# Patient Record
Sex: Female | Born: 1955 | Race: Black or African American | Hispanic: No | Marital: Married | Smoking: Former smoker
Health system: Southern US, Community
[De-identification: ages and names within clinical notes are randomized; demographics above are authoritative.]

## PROBLEM LIST (undated history)

## (undated) DIAGNOSIS — T7840XA Allergy, unspecified, initial encounter: Secondary | ICD-10-CM

## (undated) DIAGNOSIS — I10 Essential (primary) hypertension: Secondary | ICD-10-CM

## (undated) DIAGNOSIS — I739 Peripheral vascular disease, unspecified: Secondary | ICD-10-CM

## (undated) DIAGNOSIS — M1990 Unspecified osteoarthritis, unspecified site: Secondary | ICD-10-CM

## (undated) DIAGNOSIS — E7800 Pure hypercholesterolemia, unspecified: Secondary | ICD-10-CM

## (undated) HISTORY — PX: ABDOMINAL HYSTERECTOMY: SHX81

## (undated) HISTORY — DX: Unspecified osteoarthritis, unspecified site: M19.90

## (undated) HISTORY — DX: Essential (primary) hypertension: I10

## (undated) HISTORY — DX: Allergy, unspecified, initial encounter: T78.40XA

## (undated) HISTORY — PX: BREAST CYST ASPIRATION: SHX578

---

## 2003-11-19 ENCOUNTER — Ambulatory Visit: Payer: Self-pay | Admitting: Internal Medicine

## 2005-03-02 ENCOUNTER — Ambulatory Visit: Payer: Self-pay | Admitting: Internal Medicine

## 2005-04-29 ENCOUNTER — Ambulatory Visit: Payer: Self-pay | Admitting: Gastroenterology

## 2006-04-06 ENCOUNTER — Ambulatory Visit: Payer: Self-pay | Admitting: Internal Medicine

## 2007-04-13 ENCOUNTER — Ambulatory Visit: Payer: Self-pay | Admitting: Internal Medicine

## 2009-01-29 ENCOUNTER — Ambulatory Visit: Payer: Self-pay

## 2009-07-04 ENCOUNTER — Ambulatory Visit: Payer: Self-pay | Admitting: Physician Assistant

## 2010-02-17 ENCOUNTER — Ambulatory Visit: Payer: Self-pay | Admitting: Internal Medicine

## 2011-04-20 ENCOUNTER — Ambulatory Visit: Payer: Self-pay | Admitting: Internal Medicine

## 2011-08-18 ENCOUNTER — Ambulatory Visit: Payer: Self-pay | Admitting: Neurology

## 2012-08-02 ENCOUNTER — Ambulatory Visit: Payer: Self-pay | Admitting: Internal Medicine

## 2012-08-03 ENCOUNTER — Ambulatory Visit: Payer: Self-pay | Admitting: Internal Medicine

## 2012-10-17 ENCOUNTER — Ambulatory Visit: Payer: Self-pay | Admitting: Vascular Surgery

## 2012-10-17 LAB — BUN: BUN: 15 mg/dL (ref 7–18)

## 2012-10-17 LAB — CREATININE, SERUM: EGFR (Non-African Amer.): 51 — ABNORMAL LOW

## 2012-12-14 ENCOUNTER — Ambulatory Visit: Payer: Self-pay | Admitting: Internal Medicine

## 2012-12-22 ENCOUNTER — Ambulatory Visit: Payer: Self-pay | Admitting: Gastroenterology

## 2013-08-06 ENCOUNTER — Ambulatory Visit: Payer: Self-pay | Admitting: Internal Medicine

## 2013-09-22 DIAGNOSIS — G629 Polyneuropathy, unspecified: Secondary | ICD-10-CM | POA: Insufficient documentation

## 2013-09-22 DIAGNOSIS — I129 Hypertensive chronic kidney disease with stage 1 through stage 4 chronic kidney disease, or unspecified chronic kidney disease: Secondary | ICD-10-CM | POA: Insufficient documentation

## 2013-09-22 DIAGNOSIS — I739 Peripheral vascular disease, unspecified: Secondary | ICD-10-CM | POA: Insufficient documentation

## 2013-09-22 DIAGNOSIS — E7800 Pure hypercholesterolemia, unspecified: Secondary | ICD-10-CM | POA: Insufficient documentation

## 2013-09-22 DIAGNOSIS — R350 Frequency of micturition: Secondary | ICD-10-CM | POA: Insufficient documentation

## 2013-09-22 DIAGNOSIS — I70219 Atherosclerosis of native arteries of extremities with intermittent claudication, unspecified extremity: Secondary | ICD-10-CM | POA: Insufficient documentation

## 2013-09-22 DIAGNOSIS — G8929 Other chronic pain: Secondary | ICD-10-CM | POA: Insufficient documentation

## 2014-04-18 DIAGNOSIS — Z0000 Encounter for general adult medical examination without abnormal findings: Secondary | ICD-10-CM | POA: Insufficient documentation

## 2014-04-26 NOTE — Op Note (Signed)
PATIENT NAME:  Gina Nguyen, Gina Nguyen MR#:  [id number] DATE OF BIRTH:  06-Feb-1955  DATE OF PROCEDURE:  10/17/2012  PREOPERATIVE DIAGNOSIS: Atherosclerotic occlusive disease, bilateral lower extremities, with rest pain of left lower extremity.   POSTOPERATIVE DIAGNOSIS: Atherosclerotic occlusive disease, bilateral lower extremities, with rest pain of left lower extremity.    PROCEDURES PERFORMED:  1.  Abdominal aortogram.  2.  Left lower extremity distal runoff third order catheter placement.  3.  Percutaneous transluminal angioplasty and stent placement bilateral common iliac arteries to 7 mm using a kissing balloon technique.  4.  Percutaneous transluminal angioplasty of the right external iliac artery with post dilatation of the stent to 6 mm.  5.  Percutaneous transluminal angioplasty of the left external iliac artery with stent placement and post dilatation to 6 mm.   SURGEON: Levora Dredge, MD.   SEDATION: Versed 5 mg plus fentanyl 200 mcg administered IV. Continuous ECG, pulse oximetry and cardiopulmonary monitoring was performed throughout the entire procedure by the interventional radiology nurse. Total sedation time was 1 hour and 50 minutes.   ACCESS:  1.  A 6-French sheath bilateral common femoral arteries, i.e. retrograde sheath, right common femoral artery.  2.  Retrograde sheath left common femoral artery.   CONTRAST USED: Isovue 140 mL.   FLUOROSCOPY TIME: 12.6 minutes.   INDICATIONS: Ms. Duca is a 59 year old woman, who has been having increasing pain in her lower extremities, particularly the left. Physical examination as well as noninvasive studies demonstrated significant atherosclerotic occlusive disease and she is undergoing evaluation and possible intervention. Risks and benefits were reviewed. All questions answered. The patient has agreed to proceed.   DESCRIPTION OF PROCEDURE: The patient is taken to the special procedure suite, placed in the supine position. After  adequate sedation has been achieved, both groins are prepped and draped in sterile fashion.   Lidocaine 1% is infiltrated into the soft tissues overlying the right groin area and ultrasound is placed in a sterile sleeve. Ultrasound is utilized secondary to lack of appropriate landmarks to avoid vascular injury. Common femoral artery is identified. It is echolucent and pulsatile indicating patency. Image is recorded for the permanent record. A micropuncture needle is inserted into the anterior wall with direct visualization. Microwire followed by micro sheath, J-wire followed by 5-French sheath and 5-French pigtail catheter.   The pigtail catheter is positioned at the level of T12 and AP projection of the aorta is obtained. Pigtail catheter is repositioned to above the bifurcation and bilateral oblique views of the pelvis are obtained. After review of the images, a stiff angled Glidewire and subsequently a rim catheter were used to cross the bifurcation. Rim catheter is then exchanged back to the pigtail catheter. Pigtail catheter is positioned in the SFA and third order catheter placement is then performed and distal runoff is obtained. 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 There is a moderate 60% stenosis at the origin of the right common iliac. Because of the need to treat the left as well as the stenosis of the right, this is a situation with for kissing balloons with simultaneous stent deployment. Heparin 4000 units is given and a 6-French sheath is started on the left common femoral in a fashion exactly the same as described above using ultrasound, identifying the artery and performing the puncture under real-time visualization.   Bilateral Magic  torque wire is then advanced up into the aorta and 7 x 29 Omni-link stents are  advanced through both sheaths and deployed simultaneously. Followup angiography now demonstrates a good result; however, the external iliacs need to be treated given the tortuosity. I elected to use self-expanding stents for and external iliac lesions as opposed balloon expandable stents and a 7 x 40 Life Stent was selected for the right, and a 7 x 40 Absolute Pro-stent was selected for the left. These are then deployed and they are post dilated with a 6 x 4 balloons. It should be noted that the lesions were predilated with 5 mm balloons, but demonstrated very little improvement. The pigtail catheter is then reintroduced and bilateral oblique views of the pelvis down to the common femorals were obtained and subsequently a StarClose device is deployed for each groin. There are no immediate complications.   INTERPRETATION: The abdominal aorta is opacified with a bolus injection of contrast and is free of hemodynamically significant stenoses. Bilateral nephrograms are noted, normal size. Single renal arteries are noted. No evidence of renal artery stenosis.   At the aortic bifurcation, the common iliac arteries at their origins demonstrate significant disease, on the right approximately 60% on the left greater than 80% and then in the proximal external iliacs there is a 70% to 75% stenoses bilaterally. Distally, the external iliacs are widely patent bilaterally.   The right common femoral, profunda femoris and proximal SFA is patent.   The left common femoral profunda femoris is widely patent. Superficial femoral artery is widely patent. There is mild to moderate disease at Hunter's canal, but not hemodynamically significant. At the level of the trifurcation, there is diffuse disease with occlusion of the anterior tibial and posterior tibial throughout its course all the way down to the foot. There does not appear to be reconstitution of either one distally. Peroneal is occluded in its proximal half, but  reconstitutes distally and appears to fill the foot via large collateral at the level of the ankle.   Following bilateral stent placement at the origin of the common iliac arteries using the kissing balloon technique, there is complete resolution with less than 5% residual stenosis. Following placements of stents and post dilatation to 6 mm of the external iliac lesions bilaterally, there is less than 5% residual stenosis.   SUMMARY: Successful revascularization of the aortoiliac system as described above with significant distal disease noted.   ____________________________ Renford DillsGregory G. Schnier, MD ggs:aw D: 10/18/2012 10:07:37 ET T: 10/18/2012 10:35:03 ET JOB#: [id number]  cc: Renford DillsGregory G. Schnier, MD, <Dictator> Marya AmslerMarshall W. Dareen PianoAnderson, MD Renford DillsGREGORY G SCHNIER MD ELECTRONICALLY SIGNED 11/10/2012 8:13

## 2014-08-08 ENCOUNTER — Ambulatory Visit
Admission: RE | Admit: 2014-08-08 | Discharge: 2014-08-08 | Disposition: A | Discharge status: Home / Self Care | Payer: No Typology Code available for payment source | Source: Ambulatory Visit | Attending: Internal Medicine | Admitting: Internal Medicine

## 2014-08-08 ENCOUNTER — Other Ambulatory Visit: Payer: Self-pay | Admitting: Internal Medicine

## 2014-08-08 DIAGNOSIS — Z1231 Encounter for screening mammogram for malignant neoplasm of breast: Secondary | ICD-10-CM

## 2015-04-15 ENCOUNTER — Other Ambulatory Visit: Payer: Self-pay | Admitting: Internal Medicine

## 2015-04-15 DIAGNOSIS — Z1231 Encounter for screening mammogram for malignant neoplasm of breast: Secondary | ICD-10-CM

## 2015-08-08 ENCOUNTER — Ambulatory Visit
Admission: RE | Admit: 2015-08-08 | Payer: No Typology Code available for payment source | Source: Ambulatory Visit | Admitting: Gastroenterology

## 2015-08-08 ENCOUNTER — Encounter: Admission: RE | Payer: Self-pay | Source: Ambulatory Visit

## 2015-08-08 SURGERY — COLONOSCOPY WITH PROPOFOL
Anesthesia: General

## 2015-08-11 ENCOUNTER — Ambulatory Visit
Admission: RE | Admit: 2015-08-11 | Discharge: 2015-08-11 | Disposition: A | Discharge status: Home / Self Care | Payer: Managed Care, Other (non HMO) | Source: Ambulatory Visit | Attending: Internal Medicine | Admitting: Internal Medicine

## 2015-08-11 DIAGNOSIS — Z1231 Encounter for screening mammogram for malignant neoplasm of breast: Secondary | ICD-10-CM | POA: Diagnosis not present

## 2016-07-01 ENCOUNTER — Other Ambulatory Visit: Payer: Self-pay | Admitting: Internal Medicine

## 2016-07-01 DIAGNOSIS — Z1231 Encounter for screening mammogram for malignant neoplasm of breast: Secondary | ICD-10-CM

## 2016-08-16 ENCOUNTER — Ambulatory Visit
Admission: RE | Admit: 2016-08-16 | Discharge: 2016-08-16 | Disposition: A | Discharge status: Home / Self Care | Payer: Managed Care, Other (non HMO) | Source: Ambulatory Visit | Attending: Internal Medicine | Admitting: Internal Medicine

## 2016-08-16 DIAGNOSIS — Z1231 Encounter for screening mammogram for malignant neoplasm of breast: Secondary | ICD-10-CM | POA: Diagnosis not present

## 2016-09-27 ENCOUNTER — Encounter (INDEPENDENT_AMBULATORY_CARE_PROVIDER_SITE_OTHER): Payer: Self-pay | Admitting: Vascular Surgery

## 2016-09-27 ENCOUNTER — Ambulatory Visit (INDEPENDENT_AMBULATORY_CARE_PROVIDER_SITE_OTHER): Payer: Managed Care, Other (non HMO) | Admitting: Vascular Surgery

## 2016-09-27 DIAGNOSIS — M79604 Pain in right leg: Secondary | ICD-10-CM | POA: Diagnosis not present

## 2016-09-27 DIAGNOSIS — I70219 Atherosclerosis of native arteries of extremities with intermittent claudication, unspecified extremity: Secondary | ICD-10-CM | POA: Insufficient documentation

## 2016-09-27 DIAGNOSIS — M79605 Pain in left leg: Secondary | ICD-10-CM

## 2016-09-27 DIAGNOSIS — E782 Mixed hyperlipidemia: Secondary | ICD-10-CM

## 2016-09-27 DIAGNOSIS — I70213 Atherosclerosis of native arteries of extremities with intermittent claudication, bilateral legs: Secondary | ICD-10-CM | POA: Diagnosis not present

## 2016-09-27 DIAGNOSIS — I10 Essential (primary) hypertension: Secondary | ICD-10-CM

## 2016-09-27 DIAGNOSIS — M47817 Spondylosis without myelopathy or radiculopathy, lumbosacral region: Secondary | ICD-10-CM | POA: Insufficient documentation

## 2016-09-27 DIAGNOSIS — M79606 Pain in leg, unspecified: Secondary | ICD-10-CM | POA: Insufficient documentation

## 2016-09-27 DIAGNOSIS — E785 Hyperlipidemia, unspecified: Secondary | ICD-10-CM | POA: Insufficient documentation

## 2016-09-27 NOTE — Progress Notes (Signed)
MRN : [id number]  Gina Nguyen is a 61 y.o. (05-Aug-1955) female who presents with chief complaint of  Chief Complaint  Patient presents with  . New Patient (Initial Visit)    abn abi  .  History of Present Illness:    The patient is seen for evaluation of painful lower extremities and diminished pulses. Patient notes the pain is always associated with activity and is very consistent day today. Typically, the pain occurs at less than one block, progress is as activity continues to the point that the patient must stop walking. Resting including standing still for several minutes allowed resumption of the activity and the ability to walk a similar distance before stopping again. Uneven terrain and inclined shorten the distance. The pain has been progressive over the past several years. The patient states the inability to walk is now having a profound negative impact on quality of life and daily activities.  She has a past history of intervention on 10/2012 1.  Abdominal aortogram.  2.  Left lower extremity distal runoff third order catheter placement.  3.  Percutaneous transluminal angioplasty and stent placement bilateral common iliac arteries to 7 mm using a kissing balloon technique.  4.  Percutaneous transluminal angioplasty of the right external iliac artery with post dilatation of the stent to 6 mm.  5.  Percutaneous transluminal angioplasty of the left external iliac artery with stent placement and post dilatation to 6 mm.   The patient denies rest pain or dangling of an extremity off the side of the bed during the night for relief. No open wounds or sores at this time.  No history of back problems or DJD of the lumbar sacral spine.   The patient denies changes in claudication symptoms or new rest pain symptoms.  No new ulcers or wounds of the foot.  The patient's blood pressure has been stable and relatively well controlled. The patient denies amaurosis fugax or recent TIA  symptoms. There are no recent neurological changes noted. The patient denies history of DVT, PE or superficial thrombophlebitis. The patient denies recent episodes of angina or shortness of breath.     Current Meds  Medication Sig  . amLODipine (NORVASC) 5 MG tablet TAKE 1 TABLET DAILY  . Ascorbic Acid (VITAMIN C) 1000 MG tablet Take by mouth.  . Cholecalciferol (VITAMIN D3) 1000 units CAPS Take by mouth.  . clopidogrel (PLAVIX) 75 MG tablet TAKE 1 TABLET DAILY  . Cyanocobalamin (B-12 TR) 1000 MCG TBCR Take by mouth.  Marland Kitchen lisinopril (PRINIVIL,ZESTRIL) 20 MG tablet TAKE 1 TABLET DAILY  . omeprazole (PRILOSEC) 40 MG capsule TAKE 1 CAPSULE DAILY  . pravastatin (PRAVACHOL) 40 MG tablet TAKE 1 TABLET NIGHTLY  . propranolol (INDERAL) 40 MG tablet TAKE 1 TABLET TWICE A DAY  . pyridoxine (B-6) 100 MG tablet Take 100 mg by mouth daily.  Marland Kitchen torsemide (DEMADEX) 10 MG tablet TAKE 1 TABLET DAILY    Past Medical History:  Diagnosis Date  . Allergy   . Arthritis   . Hypertension     Past Surgical History:  Procedure Laterality Date  . BREAST CYST ASPIRATION Right     Social History Social History  Substance Use Topics  . Smoking status: Current Every Day Smoker  . Smokeless tobacco: Former Neurosurgeon    Types: Snuff, Chew  . Alcohol use No    Family History Family History  Problem Relation Age of Onset  . Breast cancer Mother 14    No Known  Allergies   REVIEW OF SYSTEMS (Negative unless checked)  Constitutional: Weight loss  Fever  Chills Cardiac: Chest pain   Chest pressure   Palpitations   Shortness of breath when laying flat   Shortness of breath with exertion. Vascular:  Pain in legs with walking   Pain in legs at rest  History of DVT   Phlebitis   Swelling in legs   Varicose veins   Non-healing ulcers Pulmonary:   Uses home oxygen   Productive cough   Hemoptysis   Wheeze  COPD   Asthma Neurologic:  Dizziness   Seizures   History  of stroke   History of TIA  Aphasia   Vissual changes   Weakness or numbness in arm   Weakness or numbness in leg Musculoskeletal:   Joint swelling   Joint pain   Low back pain Hematologic:  Easy bruising  Easy bleeding   Hypercoagulable state   Anemic Gastrointestinal:  Diarrhea   Vomiting  Gastroesophageal reflux/heartburn   Difficulty swallowing. Genitourinary:  Chronic kidney disease   Difficult urination  Frequent urination   Blood in urine Skin:  Rashes   Ulcers  Psychological:  History of anxiety    History of major depression.  Physical Examination  Vitals:   09/27/16 1041  BP: (!) 150/93  Pulse: 77  Resp: 16  Weight: 151 lb (68.5 kg)  Height:  (1.651 m)   Body mass index is 25.13 kg/m. Gen: WD/WN, NAD Head: [place]/AT, No temporalis wasting.  Ear/Nose/Throat: Hearing grossly intact, nares w/o erythema or drainage Eyes: PER, EOMI, sclera nonicteric.  Neck: Supple, no large masses.   Pulmonary:  Good air movement, no audible wheezing bilaterally, no use of accessory muscles.  Cardiac: RRR, no JVD Vascular:  Vessel Right Left  Radial Palpable Palpable  PT Not Palpable Not Palpable  DP Not Palpable Not Palpable  Gastrointestinal: Non-distended. No guarding/no peritoneal signs.  Musculoskeletal: M/S 5/5 throughout.  No deformity or atrophy.  Neurologic: CN 2-12 intact. Symmetrical.  Speech is fluent. Motor exam as listed above. Psychiatric: Judgment intact, Mood & affect appropriate for pt's clinical situation. Dermatologic: No rashes or ulcers noted.  No changes consistent with cellulitis. Lymph : No lichenification or skin changes of chronic lymphedema.  CBC No results found for: WBC, HGB, HCT, MCV, PLT  BMET    Component Value Date/Time   BUN 15 10/17/2012 0805   CREATININE 1.16 10/17/2012 0805   GFRNONAA 51 (L) 10/17/2012 0805   GFRAA 60 (L) 10/17/2012 0805   CrCl cannot be calculated (Patient's most recent lab  result is older than the maximum 21 days allowed.).  COAG No results found for: INR, PROTIME  Radiology No results found.    Assessment/Plan 1. Atherosclerosis of native artery of both lower extremities with intermittent claudication (HCC)  Recommend:  The patient has atypical pain symptoms for pure atherosclerotic disease. However, on physical exam there is evidence of mixed venous and arterial disease, given the diminished pulses and the edema associated with venous changes of the legs.  Noninvasive studies including ABI's and venous ultrasound of the legs will be obtained and the patient will follow up with me to review these studies.  The patient should continue walking and begin a more formal exercise program. The patient should continue his antiplatelet therapy and aggressive treatment of the lipid abnormalities.  The patient should begin wearing graduated compression socks 15-20 mmHg strength to control edema.   - VAS US AORTA/IVC/ILIACS; Future - VAS [place] UPPER  EXTREMITY ARTERIAL DUPLEX; Future  2. Pain in both lower extremities  Recommend:  The patient has atypical pain symptoms for pure atherosclerotic disease. However, on physical exam there is evidence of mixed venous and arterial disease, given the diminished pulses and the edema associated with venous changes of the legs.  Noninvasive studies including ABI's and arterial ultrasound of the legs will be obtained and the patient will follow up with me to review these studies.  The patient should continue walking and begin a more formal exercise program. The patient should continue his antiplatelet therapy and aggressive treatment of the lipid abnormalities.  The patient should begin wearing graduated compression socks 15-20 mmHg strength to control edema.   3. Lumbar and sacral osteoarthritis See #1  4. Essential hypertension Continue antihypertensive medications as already ordered, these medications have been  reviewed and there are no changes at this time.   5. Mixed hyperlipidemia Continue statin as ordered and reviewed, no changes at this time   Levora Dredge, MD  09/27/2016 10:59 AM

## 2016-11-22 ENCOUNTER — Other Ambulatory Visit (INDEPENDENT_AMBULATORY_CARE_PROVIDER_SITE_OTHER): Payer: Self-pay | Admitting: Vascular Surgery

## 2016-11-22 DIAGNOSIS — I7090 Unspecified atherosclerosis: Secondary | ICD-10-CM

## 2016-11-29 ENCOUNTER — Ambulatory Visit (INDEPENDENT_AMBULATORY_CARE_PROVIDER_SITE_OTHER): Payer: Managed Care, Other (non HMO) | Admitting: Vascular Surgery

## 2016-11-29 ENCOUNTER — Ambulatory Visit (INDEPENDENT_AMBULATORY_CARE_PROVIDER_SITE_OTHER): Payer: Managed Care, Other (non HMO)

## 2016-11-29 ENCOUNTER — Encounter (INDEPENDENT_AMBULATORY_CARE_PROVIDER_SITE_OTHER): Payer: Self-pay | Admitting: Vascular Surgery

## 2016-11-29 VITALS — BP 161/93 | HR 73 | Resp 16 | Ht 65.0 in | Wt 146.0 lb

## 2016-11-29 DIAGNOSIS — M47817 Spondylosis without myelopathy or radiculopathy, lumbosacral region: Secondary | ICD-10-CM | POA: Diagnosis not present

## 2016-11-29 DIAGNOSIS — M79604 Pain in right leg: Secondary | ICD-10-CM

## 2016-11-29 DIAGNOSIS — I70213 Atherosclerosis of native arteries of extremities with intermittent claudication, bilateral legs: Secondary | ICD-10-CM | POA: Diagnosis not present

## 2016-11-29 DIAGNOSIS — I7090 Unspecified atherosclerosis: Secondary | ICD-10-CM

## 2016-11-29 DIAGNOSIS — M79605 Pain in left leg: Secondary | ICD-10-CM

## 2016-11-29 DIAGNOSIS — I10 Essential (primary) hypertension: Secondary | ICD-10-CM | POA: Diagnosis not present

## 2016-11-29 DIAGNOSIS — E782 Mixed hyperlipidemia: Secondary | ICD-10-CM | POA: Diagnosis not present

## 2016-11-29 NOTE — Progress Notes (Signed)
MRN : [id number]  Gina Nguyen is a 61 y.o. (1955-10-14) female who presents with chief complaint of No chief complaint on file. Marland Kitchen.  History of Present Illness: The patient returns to the office for followup and review of the noninvasive studies. There have been no interval changes in lower extremity symptoms. No interval shortening of the patient's claudication distance or development of rest pain symptoms. No new ulcers or wounds have occurred since the last visit.  She has a past history of intervention on 10/2012 1. Abdominal aortogram.  2. Left lower extremity distal runoff third order catheter placement.  3. Percutaneous transluminal angioplasty and stent placement bilateral common iliac arteries to 7 mm using a kissing balloon technique.  4. Percutaneous transluminal angioplasty of the right external iliac artery with post dilatation of the stent to 6 mm.  5. Percutaneous transluminal angioplasty of the left external iliac artery with stent placement and post dilatation to 6 mm.   There have been no significant changes to the patient's overall health care.  The patient denies amaurosis fugax or recent TIA symptoms. There are no recent neurological changes noted. The patient denies history of DVT, PE or superficial thrombophlebitis. The patient denies recent episodes of angina or shortness of breath.     No outpatient medications have been marked as taking for the 11/29/16 encounter (Appointment) with Gilda CreaseSchnier, Latina CraverGregory G, MD.    Past Medical History:  Diagnosis Date  . Allergy   . Arthritis   . Hypertension     Past Surgical History:  Procedure Laterality Date  . BREAST CYST ASPIRATION Right     Social History Social History   Tobacco Use  . Smoking status: Current Every Day Smoker  . Smokeless tobacco: Former NeurosurgeonUser    Types: Snuff, Chew  Substance Use Topics  . Alcohol use: No  . Drug use: No    Family History Family History  Problem Relation Age of  Onset  . Breast cancer Mother 7269    No Known Allergies   REVIEW OF SYSTEMS (Negative unless checked)  Constitutional: [] Weight loss  [] Fever  [] Chills Cardiac: [] Chest pain   [] Chest pressure   [] Palpitations   [] Shortness of breath when laying flat   [] Shortness of breath with exertion. Vascular:  [x] Pain in legs with walking   [] Pain in legs at rest  [] History of DVT   [] Phlebitis   [] Swelling in legs   [] Varicose veins   [] Non-healing ulcers Pulmonary:   [] Uses home oxygen   [] Productive cough   [] Hemoptysis   [] Wheeze  [] COPD   [] Asthma Neurologic:  [] Dizziness   [] Seizures   [] History of stroke   [] History of TIA  [] Aphasia   [] Vissual changes   [] Weakness or numbness in arm   [] Weakness or numbness in leg Musculoskeletal:   [] Joint swelling   [] Joint pain   [] Low back pain Hematologic:  [] Easy bruising  [] Easy bleeding   [] Hypercoagulable state   [] Anemic Gastrointestinal:  [] Diarrhea   [] Vomiting  [] Gastroesophageal reflux/heartburn   [] Difficulty swallowing. Genitourinary:  [] Chronic kidney disease   [] Difficult urination  [] Frequent urination   [] Blood in urine Skin:  [] Rashes   [] Ulcers  Psychological:  [] History of anxiety   []  History of major depression.  Physical Examination  There were no vitals filed for this visit. There is no height or weight on file to calculate BMI. Gen: WD/WN, NAD Head: /AT, No temporalis wasting.  Ear/Nose/Throat: Hearing grossly intact, nares w/o erythema or drainage Eyes: PER,  EOMI, sclera nonicteric.  Neck: Supple, no large masses.   Pulmonary:  Good air movement, no audible wheezing bilaterally, no use of accessory muscles.  Cardiac: RRR, no JVD Vascular:  Vessel Right Left  Radial Palpable Palpable  PT Palpable Not Palpable  DP Trace Palpable Not Palpable  Gastrointestinal: Non-distended. No guarding/no peritoneal signs.  Musculoskeletal: M/S 5/5 throughout.  No deformity or atrophy.  Neurologic: CN 2-12 intact. Symmetrical.  Speech  is fluent. Motor exam as listed above. Psychiatric: Judgment intact, Mood & affect appropriate for pt's clinical situation. Dermatologic: No rashes or ulcers noted.  No changes consistent with cellulitis. Lymph : No lichenification or skin changes of chronic lymphedema.  CBC No results found for: WBC, HGB, HCT, MCV, PLT  BMET    Component Value Date/Time   BUN 15 10/17/2012 0805   CREATININE 1.16 10/17/2012 0805   GFRNONAA 51 (L) 10/17/2012 0805   GFRAA 60 (L) 10/17/2012 0805   CrCl cannot be calculated (Patient's most recent lab result is older than the maximum 21 days allowed.).  COAG No results found for: INR, PROTIME  Radiology No results found.   Assessment/Plan 1. Pain in both lower extremities  Recommend:  The patient has evidence of atherosclerosis of the lower extremities with claudication.  The patient does not voice lifestyle limiting changes at this point in time.  Noninvasive studies do not suggest clinically significant change.  No invasive studies, angiography or surgery at this time The patient should continue walking and begin a more formal exercise program.  The patient should continue antiplatelet therapy and aggressive treatment of the lipid abnormalities  No changes in the patient's medications at this time  The patient should continue wearing graduated compression socks 10-15 mmHg strength to control the mild edema.    2. Atherosclerosis of native artery of both lower extremities with intermittent claudication (HCC)  Recommend:  The patient has evidence of atherosclerosis of the lower extremities with claudication.  The patient does not voice lifestyle limiting changes at this point in time.  Noninvasive studies do not suggest clinically significant change.  No invasive studies, angiography or surgery at this time The patient should continue walking and begin a more formal exercise program.  The patient should continue antiplatelet therapy and  aggressive treatment of the lipid abnormalities  No changes in the patient's medications at this time  The patient should continue wearing graduated compression socks 10-15 mmHg strength to control the mild edema.    3. Essential hypertension Continue antihypertensive medications as already ordered, these medications have been reviewed and there are no changes at this time.   4. Mixed hyperlipidemia Continue statin as ordered and reviewed, no changes at this time   5. Lumbar and sacral osteoarthritis  Recommend:  The patient has atypical pain symptoms for pure atherosclerotic disease. However, on physical exam there is evidence of mixed degenerative and arterial disease, given the diminished pulses and the leg pain of the legs.  The patient should continue walking and begin a more formal exercise program. The patient should continue his antiplatelet therapy and aggressive treatment of the lipid abnormalities.  The patient should begin wearing graduated compression socks 15-20 mmHg strength to control edema.    Levora DredgeGregory Lashawnda Hancox, MD  11/29/2016 8:44 AM

## 2017-07-11 ENCOUNTER — Other Ambulatory Visit: Payer: Self-pay | Admitting: Internal Medicine

## 2017-07-11 DIAGNOSIS — Z1231 Encounter for screening mammogram for malignant neoplasm of breast: Secondary | ICD-10-CM

## 2017-08-22 ENCOUNTER — Ambulatory Visit
Admission: RE | Admit: 2017-08-22 | Discharge: 2017-08-22 | Disposition: A | Discharge status: Home / Self Care | Payer: Managed Care, Other (non HMO) | Source: Ambulatory Visit | Attending: Internal Medicine | Admitting: Internal Medicine

## 2017-08-22 DIAGNOSIS — Z1231 Encounter for screening mammogram for malignant neoplasm of breast: Secondary | ICD-10-CM

## 2017-09-12 ENCOUNTER — Ambulatory Visit (INDEPENDENT_AMBULATORY_CARE_PROVIDER_SITE_OTHER): Payer: Managed Care, Other (non HMO) | Admitting: Vascular Surgery

## 2017-09-19 ENCOUNTER — Ambulatory Visit (INDEPENDENT_AMBULATORY_CARE_PROVIDER_SITE_OTHER): Payer: Managed Care, Other (non HMO) | Admitting: Vascular Surgery

## 2017-09-19 ENCOUNTER — Encounter (INDEPENDENT_AMBULATORY_CARE_PROVIDER_SITE_OTHER): Payer: Self-pay | Admitting: Vascular Surgery

## 2017-09-19 VITALS — BP 163/92 | HR 74 | Resp 16 | Ht 65.0 in | Wt 141.0 lb

## 2017-09-19 DIAGNOSIS — I10 Essential (primary) hypertension: Secondary | ICD-10-CM

## 2017-09-19 DIAGNOSIS — E782 Mixed hyperlipidemia: Secondary | ICD-10-CM | POA: Diagnosis not present

## 2017-09-19 DIAGNOSIS — I70213 Atherosclerosis of native arteries of extremities with intermittent claudication, bilateral legs: Secondary | ICD-10-CM | POA: Diagnosis not present

## 2017-09-19 DIAGNOSIS — M47817 Spondylosis without myelopathy or radiculopathy, lumbosacral region: Secondary | ICD-10-CM

## 2017-09-20 ENCOUNTER — Encounter (INDEPENDENT_AMBULATORY_CARE_PROVIDER_SITE_OTHER): Payer: Self-pay | Admitting: Vascular Surgery

## 2017-09-20 NOTE — Progress Notes (Signed)
MRN : [id number]  Gina Nguyen is a 62 y.o. (March 04, 1955) female who presents with chief complaint of  Chief Complaint  Patient presents with  . Follow-up    ref for PVD  .  History of Present Illness:   The patient returns to the office for followup of her PAD. There has been a significant deterioration in the left lower extremity symptoms.  The patient notes interval shortening of their claudication distance and development of mild rest pain symptoms. No new ulcers or wounds have occurred since the last visit.  There have been no significant changes to the patient's overall health care.  The patient denies amaurosis fugax or recent TIA symptoms. There are no recent neurological changes noted. The patient denies history of DVT, PE or superficial thrombophlebitis. The patient denies recent episodes of angina or shortness of breath.     Current Meds  Medication Sig  . Ascorbic Acid (VITAMIN C) 1000 MG tablet Take by mouth.  . Cholecalciferol (VITAMIN D3) 1000 units CAPS Take by mouth.  . clopidogrel (PLAVIX) 75 MG tablet TAKE 1 TABLET DAILY  . Cyanocobalamin (B-12 TR) 1000 MCG TBCR Take by mouth.  Marland Kitchen lisinopril (PRINIVIL,ZESTRIL) 20 MG tablet TAKE 1 TABLET DAILY  . omeprazole (PRILOSEC) 40 MG capsule TAKE 1 CAPSULE DAILY  . pravastatin (PRAVACHOL) 40 MG tablet TAKE 1 TABLET NIGHTLY  . propranolol (INDERAL) 40 MG tablet TAKE 1 TABLET TWICE A DAY  . pyridoxine (B-6) 100 MG tablet Take 100 mg by mouth daily.  Marland Kitchen torsemide (DEMADEX) 10 MG tablet TAKE 1 TABLET DAILY    Past Medical History:  Diagnosis Date  . Allergy   . Arthritis   . Hypertension     Past Surgical History:  Procedure Laterality Date  . ABDOMINAL HYSTERECTOMY    . BREAST CYST ASPIRATION Right     Social History Social History   Tobacco Use  . Smoking status: Current Every Day Smoker  . Smokeless tobacco: Former Neurosurgeon    Types: Snuff, Chew  Substance Use Topics  . Alcohol use: No  . Drug use: No     Family History Family History  Problem Relation Age of Onset  . Breast cancer Mother 63    Allergies  Allergen Reactions  . Eggs Or Egg-Derived Products Swelling     REVIEW OF SYSTEMS (Negative unless checked)  Constitutional: [] Weight loss  [] Fever  [] Chills Cardiac: [] Chest pain   [] Chest pressure   [] Palpitations   [] Shortness of breath when laying flat   [] Shortness of breath with exertion. Vascular:  [x] Pain in legs with walking   [] Pain in legs at rest  [] History of DVT   [] Phlebitis   [] Swelling in legs   [] Varicose veins   [] Non-healing ulcers Pulmonary:   [] Uses home oxygen   [] Productive cough   [] Hemoptysis   [] Wheeze  [] COPD   [] Asthma Neurologic:  [] Dizziness   [] Seizures   [] History of stroke   [] History of TIA  [] Aphasia   [] Vissual changes   [] Weakness or numbness in arm   [] Weakness or numbness in leg Musculoskeletal:   [] Joint swelling   [] Joint pain   [] Low back pain Hematologic:  [] Easy bruising  [] Easy bleeding   [] Hypercoagulable state   [] Anemic Gastrointestinal:  [] Diarrhea   [] Vomiting  [] Gastroesophageal reflux/heartburn   [] Difficulty swallowing. Genitourinary:  [] Chronic kidney disease   [] Difficult urination  [] Frequent urination   [] Blood in urine Skin:  [] Rashes   [] Ulcers  Psychological:  [] History of anxiety   []   History of major depression.  Physical Examination  Vitals:   09/19/17 0851  BP: (!) 163/92  Pulse: 74  Resp: 16  Weight: 141 lb (64 kg)  Height: 5\' 5"  (1.651 m)   Body mass index is 23.46 kg/m. Gen: WD/WN, NAD Head: [place]/AT, No temporalis wasting.  Ear/Nose/Throat: Hearing grossly intact, nares w/o erythema or drainage Eyes: PER, EOMI, sclera nonicteric.  Neck: Supple, no large masses.   Pulmonary:  Good air movement, no audible wheezing bilaterally, no use of accessory muscles.  Cardiac: RRR, no JVD Vascular:  Vessel Right Left  Radial Palpable Palpable  PT Trace Palpable Not Palpable  DP Trace Palpable Not Palpable   Gastrointestinal: Non-distended. No guarding/no peritoneal signs.  Musculoskeletal: M/S 5/5 throughout.  No deformity or atrophy.  Neurologic: CN 2-12 intact. Symmetrical.  Speech is fluent. Motor exam as listed above. Psychiatric: Judgment intact, Mood & affect appropriate for pt's clinical situation. Dermatologic: No rashes or ulcers noted.  No changes consistent with cellulitis. Lymph : No lichenification or skin changes of chronic lymphedema.  CBC No results found for: WBC, HGB, HCT, MCV, PLT  BMET    Component Value Date/Time   BUN 15 10/17/2012 0805   CREATININE 1.16 10/17/2012 0805   GFRNONAA 51 (L) 10/17/2012 0805   GFRAA 60 (L) 10/17/2012 0805   CrCl cannot be calculated (Patient's most recent lab result is older than the maximum 21 days allowed.).  COAG No results found for: INR, PROTIME  Radiology Mm 3d Screen Breast Bilateral  Result Date: 08/22/2017 CLINICAL DATA:  Screening. EXAM: DIGITAL SCREENING BILATERAL MAMMOGRAM WITH TOMO AND CAD COMPARISON:  Previous exam(s). ACR Breast Density Category c: The breast tissue is heterogeneously dense, which may obscure small masses. FINDINGS: There are no findings suspicious for malignancy. Images were processed with CAD. IMPRESSION: No mammographic evidence of malignancy. A result letter of this screening mammogram will be mailed directly to the patient. RECOMMENDATION: Screening mammogram in one year. (Code:SM-B-01Y) BI-RADS CATEGORY  1: Negative. Electronically Signed   By: Norva PavlovElizabeth  Brown M.D.   On: 08/22/2017 11:37    Assessment/Plan 1. Atherosclerosis of native artery of both lower extremities with intermittent claudication (HCC) Recommend:  Patient should undergo arterial duplex of the lower extremity because there has been a significant deterioration in the patient's lower extremity symptoms.  The patient states they are having increased pain and a marked decrease in the distance that they can walk.  The risks and  benefits as well as the alternatives were discussed in detail with the patient.  All questions were answered.  Patient agrees to proceed and understands this could be a prelude to angiography and intervention.  The patient will follow up with me in the office to review the studies.   - VAS US ABI WITH/WO TBI; Future - VAS US LOWER EXTREMITY ARTERIAL DUPLEX; Future - VAS US AORTA/IVC/ILIACS; Future  2. Essential hypertension Continue antihypertensive medications as already ordered, these medications have been reviewed and there are no changes at this time.   3. Lumbar and sacral osteoarthritis  Recommend:  The patient has atypical pain symptoms for pure atherosclerotic disease. However, on physical exam there is evidence of arterial disease, given the diminished pulses of the legs.  Noninvasive studies including ABI's and arterial ultrasound of the legs will be obtained and the patient will follow up with me to review these studies.  The patient should continue walking and begin a more formal exercise program. The patient should continue his antiplatelet therapy and aggressive  treatment of the lipid abnormalities.  The patient should begin wearing graduated compression socks 15-20 mmHg strength to control edema.   4. Mixed hyperlipidemia Continue statin as ordered and reviewed, no changes at this time     Levora Dredge, MD  09/20/2017 3:57 PM

## 2017-10-17 ENCOUNTER — Ambulatory Visit (INDEPENDENT_AMBULATORY_CARE_PROVIDER_SITE_OTHER): Payer: Managed Care, Other (non HMO)

## 2017-10-17 ENCOUNTER — Encounter (INDEPENDENT_AMBULATORY_CARE_PROVIDER_SITE_OTHER): Payer: Self-pay | Admitting: Vascular Surgery

## 2017-10-17 ENCOUNTER — Ambulatory Visit (INDEPENDENT_AMBULATORY_CARE_PROVIDER_SITE_OTHER): Payer: Managed Care, Other (non HMO) | Admitting: Vascular Surgery

## 2017-10-17 VITALS — BP 175/103 | HR 74 | Resp 15 | Ht 64.5 in | Wt 140.0 lb

## 2017-10-17 DIAGNOSIS — M47817 Spondylosis without myelopathy or radiculopathy, lumbosacral region: Secondary | ICD-10-CM | POA: Diagnosis not present

## 2017-10-17 DIAGNOSIS — I70213 Atherosclerosis of native arteries of extremities with intermittent claudication, bilateral legs: Secondary | ICD-10-CM

## 2017-10-17 DIAGNOSIS — M79604 Pain in right leg: Secondary | ICD-10-CM

## 2017-10-17 DIAGNOSIS — E782 Mixed hyperlipidemia: Secondary | ICD-10-CM

## 2017-10-17 DIAGNOSIS — M79605 Pain in left leg: Secondary | ICD-10-CM

## 2017-10-17 DIAGNOSIS — I10 Essential (primary) hypertension: Secondary | ICD-10-CM

## 2017-10-17 DIAGNOSIS — F17210 Nicotine dependence, cigarettes, uncomplicated: Secondary | ICD-10-CM

## 2017-10-17 NOTE — Progress Notes (Signed)
MRN : [id number]  Gina Nguyen is a 62 y.o. (1955/07/17) female who presents with chief complaint of No chief complaint on file. Marland Kitchen  History of Present Illness: The patient returns to the office for followup and review of the noninvasive studies. There have been no interval changes in lower extremity symptoms. No interval shortening of the patient's claudication distance or development of rest pain symptoms. No new ulcers or wounds have occurred since the last visit.  There have been no significant changes to the patient's overall health care.  The patient denies amaurosis fugax or recent TIA symptoms. There are no recent neurological changes noted. The patient denies history of DVT, PE or superficial thrombophlebitis. The patient denies recent episodes of angina or shortness of breath.   ABI Rt=0.99 and Lt=0.89  (previous ABI's Rt=1.02 and Lt=0.68) Duplex ultrasound of the aorta and iliac arteries and the left leg arterial shows patent sten no hemodynamically significant stenosis noted  No outpatient medications have been marked as taking for the 10/17/17 encounter (Appointment) with Gilda Crease, Latina Craver, MD.    Past Medical History:  Diagnosis Date  . Allergy   . Arthritis   . Hypertension     Past Surgical History:  Procedure Laterality Date  . ABDOMINAL HYSTERECTOMY    . BREAST CYST ASPIRATION Right     Social History Social History   Tobacco Use  . Smoking status: Current Every Day Smoker  . Smokeless tobacco: Former Neurosurgeon    Types: Snuff, Chew  Substance Use Topics  . Alcohol use: No  . Drug use: No    Family History Family History  Problem Relation Age of Onset  . Breast cancer Mother 33    Allergies  Allergen Reactions  . Eggs Or Egg-Derived Products Swelling     REVIEW OF SYSTEMS (Negative unless checked)  Constitutional: [] Weight loss  [] Fever  [] Chills Cardiac: [] Chest pain   [] Chest pressure   [] Palpitations   [] Shortness of breath when laying  flat   [] Shortness of breath with exertion. Vascular:  [x] Pain in legs with walking   [] Pain in legs at rest  [] History of DVT   [] Phlebitis   [] Swelling in legs   [] Varicose veins   [] Non-healing ulcers Pulmonary:   [] Uses home oxygen   [] Productive cough   [] Hemoptysis   [] Wheeze  [] COPD   [] Asthma Neurologic:  [] Dizziness   [] Seizures   [] History of stroke   [] History of TIA  [] Aphasia   [] Vissual changes   [] Weakness or numbness in arm   [] Weakness or numbness in leg Musculoskeletal:   [] Joint swelling   [x] Joint pain   [x] Low back pain Hematologic:  [] Easy bruising  [] Easy bleeding   [] Hypercoagulable state   [] Anemic Gastrointestinal:  [] Diarrhea   [] Vomiting  [] Gastroesophageal reflux/heartburn   [] Difficulty swallowing. Genitourinary:  [] Chronic kidney disease   [] Difficult urination  [] Frequent urination   [] Blood in urine Skin:  [] Rashes   [] Ulcers  Psychological:  [] History of anxiety   []  History of major depression.  Physical Examination  There were no vitals filed for this visit. There is no height or weight on file to calculate BMI. Gen: WD/WN, NAD Head: [place]/AT, No temporalis wasting.  Ear/Nose/Throat: Hearing grossly intact, nares w/o erythema or drainage Eyes: PER, EOMI, sclera nonicteric.  Neck: Supple, no large masses.   Pulmonary:  Good air movement, no audible wheezing bilaterally, no use of accessory muscles.  Cardiac: RRR, no JVD Vascular:  Vessel Right Left  Radial Palpable Palpable  PT Not Palpable Not Palpable  DP Not Palpable Not Palpable  Gastrointestinal: Non-distended. No guarding/no peritoneal signs.  Musculoskeletal: M/S 5/5 throughout.  No deformity or atrophy.  Neurologic: CN 2-12 intact. Symmetrical.  Speech is fluent. Motor exam as listed above. Psychiatric: Judgment intact, Mood & affect appropriate for pt's clinical situation. Dermatologic: No rashes or ulcers noted.  No changes consistent with cellulitis. Lymph : No lichenification or skin changes  of chronic lymphedema.  CBC No results found for: WBC, HGB, HCT, MCV, PLT  BMET    Component Value Date/Time   BUN 15 10/17/2012 0805   CREATININE 1.16 10/17/2012 0805   GFRNONAA 51 (L) 10/17/2012 0805   GFRAA 60 (L) 10/17/2012 0805   CrCl cannot be calculated (Patient's most recent lab result is older than the maximum 21 days allowed.).  COAG No results found for: INR, PROTIME  Radiology No results found.   Assessment/Plan 1. Pain in both lower extremities  Recommend:  The patient has evidence of atherosclerosis of the lower extremities with claudication.  The patient does not voice lifestyle limiting changes at this point in time.  Noninvasive studies do not suggest clinically significant change.  No invasive studies, angiography or surgery at this time The patient should continue walking and begin a more formal exercise program.  The patient should continue antiplatelet therapy and aggressive treatment of the lipid abnormalities  No changes in the patient's medications at this time  The patient should continue wearing graduated compression socks 10-15 mmHg strength to control the mild edema.   - VAS US AORTA/IVC/ILIACS; Future - VAS [place] ABI WITH/WO TBI; Future - VAS [place] LOWER EXTREMITY ARTERIAL DUPLEX; Future  2. Mixed hyperlipidemia Continue statin as ordered and reviewed, no changes at this time   3. Atherosclerosis of native artery of both lower extremities with intermittent claudication (HCC)  Recommend:  The patient has evidence of atherosclerosis of the lower extremities with claudication.  The patient does not voice lifestyle limiting changes at this point in time.  Noninvasive studies do not suggest clinically significant change.  No invasive studies, angiography or surgery at this time The patient should continue walking and begin a more formal exercise program.  The patient should continue antiplatelet therapy and aggressive treatment of the lipid  abnormalities  No changes in the patient's medications at this time  The patient should continue wearing graduated compression socks 10-15 mmHg strength to control the mild edema.   - VAS US AORTA/IVC/ILIACS; Future - VAS [place] ABI WITH/WO TBI; Future - VAS [place] LOWER EXTREMITY ARTERIAL DUPLEX; Future  4. Lumbar and sacral osteoarthritis Continue NSAID medications as already ordered, these medications have been reviewed and there are no changes at this time.  Continued activity and therapy was stressed.   5. Essential hypertension Continue antihypertensive medications as already ordered, these medications have been reviewed and there are no changes at this time.     Levora Dredge, MD  10/17/2017 8:33 AM

## 2018-07-17 ENCOUNTER — Other Ambulatory Visit: Payer: Self-pay | Admitting: Internal Medicine

## 2018-07-17 DIAGNOSIS — Z1231 Encounter for screening mammogram for malignant neoplasm of breast: Secondary | ICD-10-CM

## 2018-08-28 ENCOUNTER — Ambulatory Visit
Admission: RE | Admit: 2018-08-28 | Discharge: 2018-08-28 | Disposition: A | Discharge status: Home / Self Care | Payer: 59 | Source: Ambulatory Visit | Attending: Internal Medicine | Admitting: Internal Medicine

## 2018-08-28 DIAGNOSIS — Z1231 Encounter for screening mammogram for malignant neoplasm of breast: Secondary | ICD-10-CM | POA: Diagnosis present

## 2018-10-23 ENCOUNTER — Encounter (INDEPENDENT_AMBULATORY_CARE_PROVIDER_SITE_OTHER): Payer: Managed Care, Other (non HMO)

## 2018-10-23 ENCOUNTER — Ambulatory Visit (INDEPENDENT_AMBULATORY_CARE_PROVIDER_SITE_OTHER): Payer: Managed Care, Other (non HMO) | Admitting: Vascular Surgery

## 2018-11-27 ENCOUNTER — Encounter (INDEPENDENT_AMBULATORY_CARE_PROVIDER_SITE_OTHER): Payer: Managed Care, Other (non HMO)

## 2018-11-27 ENCOUNTER — Ambulatory Visit (INDEPENDENT_AMBULATORY_CARE_PROVIDER_SITE_OTHER): Payer: Managed Care, Other (non HMO) | Admitting: Vascular Surgery

## 2018-12-07 ENCOUNTER — Ambulatory Visit (INDEPENDENT_AMBULATORY_CARE_PROVIDER_SITE_OTHER): Payer: Managed Care, Other (non HMO)

## 2018-12-07 ENCOUNTER — Ambulatory Visit (INDEPENDENT_AMBULATORY_CARE_PROVIDER_SITE_OTHER): Payer: Managed Care, Other (non HMO) | Admitting: Vascular Surgery

## 2018-12-07 ENCOUNTER — Other Ambulatory Visit: Payer: Self-pay

## 2018-12-07 ENCOUNTER — Encounter (INDEPENDENT_AMBULATORY_CARE_PROVIDER_SITE_OTHER): Payer: Self-pay | Admitting: Vascular Surgery

## 2018-12-07 VITALS — BP 151/87 | HR 83 | Resp 16 | Wt 135.0 lb

## 2018-12-07 DIAGNOSIS — I70213 Atherosclerosis of native arteries of extremities with intermittent claudication, bilateral legs: Secondary | ICD-10-CM

## 2018-12-07 DIAGNOSIS — M79605 Pain in left leg: Secondary | ICD-10-CM

## 2018-12-07 DIAGNOSIS — M79604 Pain in right leg: Secondary | ICD-10-CM

## 2018-12-07 DIAGNOSIS — E782 Mixed hyperlipidemia: Secondary | ICD-10-CM | POA: Diagnosis not present

## 2018-12-07 DIAGNOSIS — I10 Essential (primary) hypertension: Secondary | ICD-10-CM | POA: Diagnosis not present

## 2018-12-07 DIAGNOSIS — M47817 Spondylosis without myelopathy or radiculopathy, lumbosacral region: Secondary | ICD-10-CM

## 2018-12-07 NOTE — Progress Notes (Signed)
MRN : [id number]  Gina Nguyen is a 63 y.o. (14-Apr-1955) female who presents with chief complaint of No chief complaint on file. Marland Kitchen  History of Present Illness:   The patient returns to the office for followup and review of the noninvasive studies.   She is s/p on 10/18/2012: Percutaneous transluminal angioplasty and stent placement bilateral common iliac arteries to 7 mm using a kissing balloon technique.  Percutaneous transluminal angioplasty of the right external iliac artery with post dilatation of the stent to 6 mm.  Percutaneous transluminal angioplasty of the left external iliac artery with stent placement and post dilatation to 6 mm.   There have been no interval changes in lower extremity symptoms. No interval shortening of the patient's claudication distance or development of rest pain symptoms. No new ulcers or wounds have occurred since the last visit.  There have been no significant changes to the patient's overall health care.  The patient denies amaurosis fugax or recent TIA symptoms. There are no recent neurological changes noted. The patient denies history of DVT, PE or superficial thrombophlebitis. The patient denies recent episodes of angina or shortness of breath.   ABI Rt=1.08 and Lt=0.79  (previous ABI's Rt=0.99 and Lt=0.89) Duplex ultrasound of the aorta and iliac arteries and the left leg arterial shows patent stent no hemodynamically significant stenosis noted  No outpatient medications have been marked as taking for the 12/07/18 encounter (Appointment) with Delana Meyer, Dolores Lory, MD.    Past Medical History:  Diagnosis Date  . Allergy   . Arthritis   . Hypertension     Past Surgical History:  Procedure Laterality Date  . ABDOMINAL HYSTERECTOMY    . BREAST CYST ASPIRATION Right     Social History Social History   Tobacco Use  . Smoking status: Current Every Day Smoker  . Smokeless tobacco: Former Systems developer    Types: Snuff, Chew  Substance Use Topics   . Alcohol use: No  . Drug use: No    Family History Family History  Problem Relation Age of Onset  . Breast cancer Mother 36    Allergies  Allergen Reactions  . Eggs Or Egg-Derived Products Swelling     REVIEW OF SYSTEMS (Negative unless checked)  Constitutional: [] Weight loss  [] Fever  [] Chills Cardiac: [] Chest pain   [] Chest pressure   [] Palpitations   [] Shortness of breath when laying flat   [] Shortness of breath with exertion. Vascular:  [x] Pain in legs with walking   [] Pain in legs at rest  [] History of DVT   [] Phlebitis   [] Swelling in legs   [] Varicose veins   [] Non-healing ulcers Pulmonary:   [] Uses home oxygen   [] Productive cough   [] Hemoptysis   [] Wheeze  [] COPD   [] Asthma Neurologic:  [] Dizziness   [] Seizures   [] History of stroke   [] History of TIA  [] Aphasia   [] Vissual changes   [] Weakness or numbness in arm   [x] Weakness or numbness in leg Musculoskeletal:   [] Joint swelling   [x] Joint pain   [x] Low back pain Hematologic:  [] Easy bruising  [] Easy bleeding   [] Hypercoagulable state   [] Anemic Gastrointestinal:  [] Diarrhea   [] Vomiting  [] Gastroesophageal reflux/heartburn   [] Difficulty swallowing. Genitourinary:  [] Chronic kidney disease   [] Difficult urination  [] Frequent urination   [] Blood in urine Skin:  [] Rashes   [] Ulcers  Psychological:  [] History of anxiety   []  History of major depression.  Physical Examination  There were no vitals filed for this visit. There is no  height or weight on file to calculate BMI. Gen: WD/WN, NAD Head: [place]/AT, No temporalis wasting.  Ear/Nose/Throat: Hearing grossly intact, nares w/o erythema or drainage Eyes: PER, EOMI, sclera nonicteric.  Neck: Supple, no large masses.   Pulmonary:  Good air movement, no audible wheezing bilaterally, no use of accessory muscles.  Cardiac: RRR, no JVD Vascular:   Vessel Right Left  Radial Palpable Palpable  PT Palpable Palpable  DP Palpable Palpable  Gastrointestinal: Non-distended.  No guarding/no peritoneal signs.  Musculoskeletal: M/S 5/5 throughout.  No deformity or atrophy.  Neurologic: CN 2-12 intact. Symmetrical.  Speech is fluent. Motor exam as listed above. Psychiatric: Judgment intact, Mood & affect appropriate for pt's clinical situation. Dermatologic: No rashes or ulcers noted.  No changes consistent with cellulitis. Lymph : No lichenification or skin changes of chronic lymphedema.  CBC No results found for: WBC, HGB, HCT, MCV, PLT  BMET    Component Value Date/Time   BUN 15 10/17/2012 0805   CREATININE 1.16 10/17/2012 0805   GFRNONAA 51 (L) 10/17/2012 0805   GFRAA 60 (L) 10/17/2012 0805   CrCl cannot be calculated (Patient's most recent lab result is older than the maximum 21 days allowed.).  COAG No results found for: INR, PROTIME  Radiology No results found.   Assessment/Plan 1. Atherosclerosis of native artery of both lower extremities with intermittent claudication (HCC) Recommend:  The patient has evidence of atherosclerosis of the lower extremities with claudication.  The patient does not voice lifestyle limiting changes at this point in time.  Noninvasive studies do not suggest clinically significant change.  No invasive studies, angiography or surgery at this time The patient should continue walking and begin a more formal exercise program.  The patient should continue antiplatelet therapy and aggressive treatment of the lipid abnormalities  No changes in the patient's medications at this time  The patient should continue wearing graduated compression socks 10-15 mmHg strength to control the mild edema.   - ABI; Future - LE ARTERIAL; Future  2. Essential hypertension Continue antihypertensive medications as already ordered, these medications have been reviewed and there are no changes at this time.   3. Mixed hyperlipidemia Continue statin as ordered and reviewed, no changes at this time   4. Lumbar and sacral  osteoarthritis Continue NSAID medications as already ordered, these medications have been reviewed and there are no changes at this time.  Continued activity and therapy was stressed.    Levora Dredge, MD  12/07/2018 8:09 AM

## 2019-07-20 ENCOUNTER — Other Ambulatory Visit: Payer: Self-pay | Admitting: Internal Medicine

## 2019-07-20 DIAGNOSIS — Z1231 Encounter for screening mammogram for malignant neoplasm of breast: Secondary | ICD-10-CM

## 2019-08-29 ENCOUNTER — Other Ambulatory Visit: Payer: Self-pay

## 2019-08-29 ENCOUNTER — Ambulatory Visit
Admission: RE | Admit: 2019-08-29 | Discharge: 2019-08-29 | Disposition: A | Discharge status: Home / Self Care | Payer: 59 | Source: Ambulatory Visit | Attending: Internal Medicine | Admitting: Internal Medicine

## 2019-08-29 DIAGNOSIS — Z1231 Encounter for screening mammogram for malignant neoplasm of breast: Secondary | ICD-10-CM | POA: Diagnosis not present

## 2019-12-13 ENCOUNTER — Encounter (INDEPENDENT_AMBULATORY_CARE_PROVIDER_SITE_OTHER): Payer: Self-pay | Admitting: Vascular Surgery

## 2019-12-13 ENCOUNTER — Other Ambulatory Visit: Payer: Self-pay

## 2019-12-13 ENCOUNTER — Ambulatory Visit (INDEPENDENT_AMBULATORY_CARE_PROVIDER_SITE_OTHER): Payer: 59

## 2019-12-13 ENCOUNTER — Ambulatory Visit (INDEPENDENT_AMBULATORY_CARE_PROVIDER_SITE_OTHER): Payer: 59 | Admitting: Vascular Surgery

## 2019-12-13 VITALS — BP 146/85 | HR 82 | Resp 16 | Ht 65.0 in | Wt 145.0 lb

## 2019-12-13 DIAGNOSIS — I70213 Atherosclerosis of native arteries of extremities with intermittent claudication, bilateral legs: Secondary | ICD-10-CM

## 2019-12-13 DIAGNOSIS — I10 Essential (primary) hypertension: Secondary | ICD-10-CM

## 2019-12-13 DIAGNOSIS — E782 Mixed hyperlipidemia: Secondary | ICD-10-CM | POA: Diagnosis not present

## 2019-12-13 DIAGNOSIS — M47817 Spondylosis without myelopathy or radiculopathy, lumbosacral region: Secondary | ICD-10-CM

## 2019-12-13 NOTE — Progress Notes (Signed)
MRN : [id number]  Gina Nguyen is a 64 y.o. ([birth year]) female who presents with chief complaint of leg pain.  History of Present Illness:   The patient returns to the office for followup and review of the noninvasive studies. There has been a significant deterioration in the lower extremity symptoms.  The patient notes interval shortening of their claudication distance and development of mild rest pain symptoms. No new ulcers or wounds have occurred since the last visit.  There have been no significant changes to the patient's overall health care.  The patient denies amaurosis fugax or recent TIA symptoms. There are no recent neurological changes noted. The patient denies history of DVT, PE or superficial thrombophlebitis. The patient denies recent episodes of angina or shortness of breath.   ABI's Rt=0.94 and Lt=0.71 (previous ABI's Rt=1.08 and Lt=0.79) Duplex US of the lower extremity arterial system shows moderate stenosis of the left common femoral associated with distal disease  No outpatient medications have been marked as taking for the 12/13/19 encounter (Appointment) with Gilda Crease, Latina Craver, MD.    Past Medical History:  Diagnosis Date  . Allergy   . Arthritis   . Hypertension     Past Surgical History:  Procedure Laterality Date  . ABDOMINAL HYSTERECTOMY    . BREAST CYST ASPIRATION Right     Social History Social History   Tobacco Use  . Smoking status: Current Every Day Smoker  . Smokeless tobacco: Former Neurosurgeon    Types: Snuff, Chew  Substance Use Topics  . Alcohol use: No  . Drug use: No    Family History Family History  Problem Relation Age of Onset  . Breast cancer Mother 51    Allergies  Allergen Reactions  . Influenza Vaccines     Other reaction(s): Other (See Comments) Allergy to eggs  . Eggs Or Egg-Derived Products Swelling     REVIEW OF SYSTEMS (Negative unless checked)  Constitutional: [] Weight loss  [] Fever  [] Chills Cardiac:  [] Chest pain   [] Chest pressure   [] Palpitations   [] Shortness of breath when laying flat   [] Shortness of breath with exertion. Vascular:  [x] Pain in legs with walking   [] Pain in legs at rest  [] History of DVT   [] Phlebitis   [] Swelling in legs   [] Varicose veins   [] Non-healing ulcers Pulmonary:   [] Uses home oxygen   [] Productive cough   [] Hemoptysis   [] Wheeze  [] COPD   [] Asthma Neurologic:  [] Dizziness   [] Seizures   [] History of stroke   [] History of TIA  [] Aphasia   [] Vissual changes   [] Weakness or numbness in arm   [] Weakness or numbness in leg Musculoskeletal:   [] Joint swelling   [x] Joint pain   [x] Low back pain Hematologic:  [] Easy bruising  [] Easy bleeding   [] Hypercoagulable state   [] Anemic Gastrointestinal:  [] Diarrhea   [] Vomiting  [] Gastroesophageal reflux/heartburn   [] Difficulty swallowing. Genitourinary:  [] Chronic kidney disease   [] Difficult urination  [] Frequent urination   [] Blood in urine Skin:  [] Rashes   [] Ulcers  Psychological:  [] History of anxiety   []  History of major depression.  Physical Examination  There were no vitals filed for this visit. There is no height or weight on file to calculate BMI. Gen: WD/WN, NAD Head: [place]/AT, No temporalis wasting.  Ear/Nose/Throat: Hearing grossly intact, nares w/o erythema or drainage Eyes: PER, EOMI, sclera nonicteric.  Neck: Supple, no large masses.   Pulmonary:  Good air movement, no audible wheezing bilaterally, no use of  accessory muscles.  Cardiac: RRR, no JVD Vascular:  Vessel Right Left  Radial Palpable Palpable  PT Palpable Not Palpable  DP Palpable Not Palpable  Gastrointestinal: Non-distended. No guarding/no peritoneal signs.  Musculoskeletal: M/S 5/5 throughout.  No deformity or atrophy.  Neurologic: CN 2-12 intact. Symmetrical.  Speech is fluent. Motor exam as listed above. Psychiatric: Judgment intact, Mood & affect appropriate for pt's clinical situation. Dermatologic: No rashes or ulcers noted.  No  changes consistent with cellulitis. Lymph : No lichenification or skin changes of chronic lymphedema.  CBC No results found for: WBC, HGB, HCT, MCV, PLT  BMET    Component Value Date/Time   BUN 15 10/17/2012 0805   CREATININE 1.16 10/17/2012 0805   GFRNONAA 51 (L) 10/17/2012 0805   GFRAA 60 (L) 10/17/2012 0805   CrCl cannot be calculated (Patient's most recent lab result is older than the maximum 21 days allowed.).  COAG No results found for: INR, PROTIME  Radiology No results found.   Assessment/Plan 1. Atherosclerosis of native artery of both lower extremities with intermittent claudication (HCC) Recommend:  The patient has experienced increased symptoms and is now describing lifestyle limiting claudication and mild rest pain.  Given the severity of the patient's lower extremity symptoms the patient should undergo angiography and intervention.  Risk and benefits were reviewed the patient.  Indications for the procedure were reviewed.  All questions were answered, the patient agrees to proceed.   The patient should continue walking and begin a more formal exercise program.  The patient should continue antiplatelet therapy and aggressive treatment of the lipid abnormalities  The patient will follow up with me after the angiogram.   2. Essential hypertension Continue antihypertensive medications as already ordered, these medications have been reviewed and there are no changes at this time.   3. Lumbar and sacral osteoarthritis Continue NSAID medications as already ordered, these medications have been reviewed and there are no changes at this time.  Continued activity and therapy was stressed.   4. Mixed hyperlipidemia Continue statin as ordered and reviewed, no changes at this time    Levora Dredge, MD  12/13/2019 9:01 AM

## 2019-12-14 ENCOUNTER — Telehealth (INDEPENDENT_AMBULATORY_CARE_PROVIDER_SITE_OTHER): Payer: Self-pay

## 2019-12-14 NOTE — Telephone Encounter (Signed)
I attempted to contact the patient and a message was left for a return call. 

## 2019-12-14 NOTE — Telephone Encounter (Signed)
Patient called back and is now scheduled with Dr. Gilda Crease for a left leg angio on 01/08/20 with a 8..00 am arrival time to the [facility]. Covid testing on 01/04/20 between 8-1 pm at the [facility]. Pre-procedure instructions were discussed and will be mailed.

## 2020-01-04 ENCOUNTER — Other Ambulatory Visit: Payer: 59 | Attending: Vascular Surgery

## 2020-01-07 ENCOUNTER — Other Ambulatory Visit (INDEPENDENT_AMBULATORY_CARE_PROVIDER_SITE_OTHER): Payer: Self-pay | Admitting: Nurse Practitioner

## 2020-01-07 NOTE — Telephone Encounter (Signed)
Due to the patient not having the covid test on 01/04/20 she has been rescheduled to 01/15/20 with a 11:00 am arrival time to the [facility]. Covid testing on 01/11/20 at the [facility]. Pre-procedure instructions were discussed and will be mailed. Patient was offered to have her covid test today and declined due to the weather.

## 2020-01-08 DIAGNOSIS — L97909 Non-pressure chronic ulcer of unspecified part of unspecified lower leg with unspecified severity: Secondary | ICD-10-CM

## 2020-01-11 ENCOUNTER — Other Ambulatory Visit
Admission: RE | Admit: 2020-01-11 | Discharge: 2020-01-11 | Disposition: A | Discharge status: Home / Self Care | Payer: 59 | Source: Ambulatory Visit | Attending: Vascular Surgery | Admitting: Vascular Surgery

## 2020-01-11 ENCOUNTER — Other Ambulatory Visit: Payer: Self-pay

## 2020-01-11 DIAGNOSIS — Z20822 Contact with and (suspected) exposure to covid-19: Secondary | ICD-10-CM | POA: Insufficient documentation

## 2020-01-11 DIAGNOSIS — Z01812 Encounter for preprocedural laboratory examination: Secondary | ICD-10-CM | POA: Insufficient documentation

## 2020-01-11 LAB — SARS CORONAVIRUS 2 (TAT 6-24 HRS): SARS Coronavirus 2: NEGATIVE

## 2020-01-14 ENCOUNTER — Encounter (INDEPENDENT_AMBULATORY_CARE_PROVIDER_SITE_OTHER): Payer: Self-pay | Admitting: Nurse Practitioner

## 2020-01-14 ENCOUNTER — Other Ambulatory Visit (INDEPENDENT_AMBULATORY_CARE_PROVIDER_SITE_OTHER): Payer: Self-pay | Admitting: Nurse Practitioner

## 2020-01-15 ENCOUNTER — Encounter
Admission: RE | Disposition: A | Discharge status: Home / Self Care | Payer: Self-pay | Source: Home / Self Care | Attending: Vascular Surgery

## 2020-01-15 ENCOUNTER — Ambulatory Visit
Admission: RE | Admit: 2020-01-15 | Discharge: 2020-01-15 | Disposition: A | Discharge status: Home / Self Care | Payer: 59 | Attending: Vascular Surgery | Admitting: Vascular Surgery

## 2020-01-15 ENCOUNTER — Other Ambulatory Visit: Payer: Self-pay

## 2020-01-15 ENCOUNTER — Encounter: Payer: Self-pay | Admitting: Vascular Surgery

## 2020-01-15 DIAGNOSIS — I70211 Atherosclerosis of native arteries of extremities with intermittent claudication, right leg: Secondary | ICD-10-CM | POA: Diagnosis not present

## 2020-01-15 DIAGNOSIS — E782 Mixed hyperlipidemia: Secondary | ICD-10-CM | POA: Insufficient documentation

## 2020-01-15 DIAGNOSIS — L97909 Non-pressure chronic ulcer of unspecified part of unspecified lower leg with unspecified severity: Secondary | ICD-10-CM | POA: Diagnosis not present

## 2020-01-15 DIAGNOSIS — Z79899 Other long term (current) drug therapy: Secondary | ICD-10-CM | POA: Diagnosis not present

## 2020-01-15 DIAGNOSIS — I70222 Atherosclerosis of native arteries of extremities with rest pain, left leg: Secondary | ICD-10-CM

## 2020-01-15 DIAGNOSIS — F17290 Nicotine dependence, other tobacco product, uncomplicated: Secondary | ICD-10-CM | POA: Diagnosis not present

## 2020-01-15 DIAGNOSIS — I10 Essential (primary) hypertension: Secondary | ICD-10-CM | POA: Insufficient documentation

## 2020-01-15 DIAGNOSIS — I70299 Other atherosclerosis of native arteries of extremities, unspecified extremity: Secondary | ICD-10-CM

## 2020-01-15 HISTORY — DX: Pure hypercholesterolemia, unspecified: E78.00

## 2020-01-15 HISTORY — PX: LOWER EXTREMITY ANGIOGRAPHY: CATH118251

## 2020-01-15 LAB — CREATININE, SERUM
Creatinine, Ser: 1.03 mg/dL — ABNORMAL HIGH (ref 0.44–1.00)
GFR, Estimated: 60 mL/min — ABNORMAL LOW (ref >60)

## 2020-01-15 LAB — BUN: BUN: 11 mg/dL (ref 8–23)

## 2020-01-15 SURGERY — LOWER EXTREMITY ANGIOGRAPHY
Anesthesia: Moderate Sedation | Site: Leg Lower | Laterality: Left

## 2020-01-15 MED ORDER — FENTANYL CITRATE (PF) 100 MCG/2ML IJ SOLN
INTRAMUSCULAR | Status: DC | PRN
  Administered 2020-01-15: 50 ug via INTRAVENOUS

## 2020-01-15 MED ORDER — DIPHENHYDRAMINE HCL 50 MG/ML IJ SOLN
INTRAMUSCULAR | Status: AC
  Filled 2020-01-15: qty 1

## 2020-01-15 MED ORDER — SODIUM CHLORIDE 0.9 % IV SOLN: 250.0000 mL | INTRAVENOUS | Status: DC | PRN

## 2020-01-15 MED ORDER — MIDAZOLAM HCL 5 MG/5ML IJ SOLN
INTRAMUSCULAR | Status: AC
  Filled 2020-01-15: qty 5

## 2020-01-15 MED ORDER — ACETAMINOPHEN 325 MG PO TABS: 650.0000 mg | ORAL_TABLET | ORAL | Status: DC | PRN

## 2020-01-15 MED ORDER — IODIXANOL 320 MG/ML IV SOLN
INTRAVENOUS | Status: DC | PRN
  Administered 2020-01-15: 65 mL

## 2020-01-15 MED ORDER — METHYLPREDNISOLONE SODIUM SUCC 125 MG IJ SOLR
INTRAMUSCULAR | Status: AC
  Filled 2020-01-15: qty 2

## 2020-01-15 MED ORDER — CEFAZOLIN SODIUM-DEXTROSE 2-4 GM/100ML-% IV SOLN: 2.0000 g | Freq: Once | INTRAVENOUS | Status: AC

## 2020-01-15 MED ORDER — MIDAZOLAM HCL 2 MG/2ML IJ SOLN
INTRAMUSCULAR | Status: DC | PRN
  Administered 2020-01-15: 2 mg via INTRAVENOUS
  Administered 2020-01-15: 0.5 mg via INTRAVENOUS

## 2020-01-15 MED ORDER — SODIUM CHLORIDE 0.9% FLUSH: 3.0000 mL | Freq: Two times a day (BID) | INTRAVENOUS | Status: DC

## 2020-01-15 MED ORDER — ONDANSETRON HCL 4 MG/2ML IJ SOLN: 4.0000 mg | Freq: Four times a day (QID) | INTRAMUSCULAR | Status: DC | PRN

## 2020-01-15 MED ORDER — HEPARIN SODIUM (PORCINE) 1000 UNIT/ML IJ SOLN
INTRAMUSCULAR | Status: AC
  Filled 2020-01-15: qty 1

## 2020-01-15 MED ORDER — METHYLPREDNISOLONE SODIUM SUCC 125 MG IJ SOLR
125.0000 mg | Freq: Once | INTRAMUSCULAR | Status: AC | PRN
  Administered 2020-01-15: 125 mg via INTRAVENOUS

## 2020-01-15 MED ORDER — FAMOTIDINE 20 MG PO TABS
ORAL_TABLET | ORAL | Status: AC
  Filled 2020-01-15: qty 2

## 2020-01-15 MED ORDER — HYDROMORPHONE HCL 1 MG/ML IJ SOLN: 1.0000 mg | Freq: Once | INTRAMUSCULAR | Status: DC | PRN

## 2020-01-15 MED ORDER — MIDAZOLAM HCL 2 MG/ML PO SYRP: 8.0000 mg | ORAL_SOLUTION | Freq: Once | ORAL | Status: DC | PRN

## 2020-01-15 MED ORDER — SODIUM CHLORIDE 0.9 % IV SOLN: INTRAVENOUS | Status: DC

## 2020-01-15 MED ORDER — HYDRALAZINE HCL 20 MG/ML IJ SOLN: 5.0000 mg | INTRAMUSCULAR | Status: DC | PRN

## 2020-01-15 MED ORDER — DIPHENHYDRAMINE HCL 50 MG/ML IJ SOLN
50.0000 mg | Freq: Once | INTRAMUSCULAR | Status: AC | PRN
  Administered 2020-01-15: 50 mg via INTRAVENOUS

## 2020-01-15 MED ORDER — CEFAZOLIN SODIUM-DEXTROSE 2-4 GM/100ML-% IV SOLN
INTRAVENOUS | Status: AC
  Administered 2020-01-15: 2 g via INTRAVENOUS
  Filled 2020-01-15: qty 100

## 2020-01-15 MED ORDER — LABETALOL HCL 5 MG/ML IV SOLN: 10.0000 mg | INTRAVENOUS | Status: DC | PRN

## 2020-01-15 MED ORDER — SODIUM CHLORIDE 0.9% FLUSH
3.0000 mL | INTRAVENOUS | Status: DC | PRN
Start: 2020-01-15 — End: 2020-01-15

## 2020-01-15 MED ORDER — MORPHINE SULFATE (PF) 4 MG/ML IV SOLN: 2.0000 mg | INTRAVENOUS | Status: DC | PRN

## 2020-01-15 MED ORDER — FENTANYL CITRATE (PF) 100 MCG/2ML IJ SOLN
INTRAMUSCULAR | Status: AC
  Filled 2020-01-15: qty 2

## 2020-01-15 MED ORDER — OXYCODONE HCL 5 MG PO TABS: 5.0000 mg | ORAL_TABLET | ORAL | Status: DC | PRN

## 2020-01-15 MED ORDER — FAMOTIDINE 20 MG PO TABS
40.0000 mg | ORAL_TABLET | Freq: Once | ORAL | Status: AC | PRN
  Administered 2020-01-15: 40 mg via ORAL

## 2020-01-15 SURGICAL SUPPLY — 14 items
CATH PIG 70CM (CATHETERS) ×2 IMPLANT
CATH TEMPO 5F RIM 65CM (CATHETERS) ×2 IMPLANT
CATH VS15FR (CATHETERS) ×2 IMPLANT
COVER PROBE U/S 5X48 (MISCELLANEOUS) ×2 IMPLANT
DEVICE STARCLOSE SE CLOSURE (Vascular Products) ×2 IMPLANT
GLIDECATH 4FR STR (CATHETERS) ×2 IMPLANT
GLIDEWIRE ADV .035X260CM (WIRE) ×2 IMPLANT
NEEDLE ENTRY 21GA 7CM ECHOTIP (NEEDLE) ×2 IMPLANT
PACK ANGIOGRAPHY (CUSTOM PROCEDURE TRAY) ×2 IMPLANT
SET INTRO CAPELLA COAXIAL (SET/KITS/TRAYS/PACK) ×2 IMPLANT
SHEATH BRITE TIP 5FRX11 (SHEATH) ×2 IMPLANT
SYR MEDRAD MARK 7 150ML (SYRINGE) ×2 IMPLANT
TUBING CONTRAST HIGH PRESS 72 (TUBING) ×2 IMPLANT
WIRE GUIDERIGHT .035X150 (WIRE) ×2 IMPLANT

## 2020-01-15 NOTE — Op Note (Signed)
[facility]  Percutaneous Study/Intervention Procedural Note   Date of Surgery: 01/15/2020,10:43 AM  Surgeon:Talbot Monarch, Latina Craver   Pre-operative Diagnosis: Atherosclerotic occlusive disease bilateral lower extremities with lifestyle limiting claudication and mild rest pain symptoms left lower extremity  Post-operative diagnosis:  Same  Procedure(s) Performed:  1. abdominal aortogram  2. left lower extremity distal runoff third order catheter placement  3. Star close right common femoral artery   Anesthesia: Conscious sedation was administered by the interventional radiology RN under my direct supervision. IV Versed plus fentanyl were utilized. Continuous ECG, pulse oximetry and blood pressure was monitored throughout the entire procedure.  Conscious sedation was administered for a total of 36 minutes and 25 seconds.  Sheath: 5 French Pinnacle retrograde right common femoral  Contrast: 65 cc   Fluoroscopy Time: 6.7 minutes  Indications:  The patient presents to [facility] with atherosclerotic occlusive disease bilateral lower extremities with lifestyle limiting claudication of the left lower extremity and mild rest pain symptoms.  Pedal pulses are nonpalpable bilaterally suggesting atherosclerotic occlusive disease.  The risks and benefits as well as alternative therapies for lower extremity revascularization are reviewed with the patient all questions are answered the patient agrees to proceed.  The patient is therefore undergoing angiography with the hope for intervention for limb salvage.   Procedure:  Gina Nguyen a 65 y.o. female who was identified and appropriate procedural time out was performed.  The patient was then placed supine on the table and prepped and draped in the usual sterile fashion.  Ultrasound was used to evaluate the right common femoral artery.  It was echolucent and pulsatile indicating it is patent .  An ultrasound image was  acquired for the permanent record.  A micropuncture needle was used to access the right common femoral artery under direct ultrasound guidance.  The microwire was then advanced under fluoroscopic guidance without difficulty followed by the micro-sheath.  A 0.035 J wire was advanced without resistance and a 5Fr sheath was placed.    Pigtail catheter was then advanced to the level of T12 and AP projection of the aorta was obtained. Pigtail catheter was then repositioned to above the bifurcation and bilateral oblique views of the pelvis was obtained.  Advantage Glidewire and VS 1 catheter was then used across the bifurcation and the VS 1 catheter was exchanged for a 4 French straight glide catheter.  The glide catheter was positioned in the distal external iliac artery.  LAO and RAO views of the left groin was then obtained. Wire was reintroduced and the wire negotiated into the SFA and the catheter was advanced into the SFA. Distal runoff was then performed.  After review of the images the catheter was removed over wire and an RAO view of the groin was obtained. StarClose device was deployed without difficulty.   Findings:   Aortogram: The abdominal aorta is opacified with a bolus injection of contrast.  There are no hemodynamically significant lesions noted.  Previously placed common iliac artery stents are noted.  In the oblique views they are widely patent.  External iliac arteries are widely patent.  Left Lower Extremity: The left common femoral is patent.  There is mild calcific plaque noted.  It is very short in length with a high femoral bifurcation.  The ostia of the SFA demonstrates a 60 to 70% stenosis.  There is a 50% stenosis in the ostia of the profunda femoris.  In the mid SFA there is a 70% stenosis.  Distally at Hunter's canal the SFA and popliteal are patent without evidence of hemodynamically significant stenosis.  The trifurcation is profoundly diseased and there is occlusion of the  anterior tibial and posterior tibial throughout their entire length.  Neither is reconstituted distally.  The tibioperoneal trunk and peroneal are heavily diseased and there are multiple occlusions before the distal peroneal is reconstituted with 2 large collaterals filling the dorsalis pedis and the plantar vessels.   Disposition: Patient was taken to the recovery room in stable condition having tolerated the procedure well.  Gina Nguyen 01/15/2020,10:43 AM

## 2020-01-15 NOTE — H&P (Signed)
@LOGO @   MRN :  Gina Nguyen is a 65 y.o. (07/15/1955) female who presents with chief complaint of No chief complaint on file. 04/15/1955  History of Present Illness:   The patient presents to [facility] for angiography and possible intervention of the left lower extremity.  Over the past several months has been a significant deterioration in the lower extremity symptoms. The patient notes interval shortening of their claudication distance and development of mild rest pain symptoms. No new ulcers or wounds have occurred since the last visit.  There have been no significant changes to the patient's overall health care.  The patient denies amaurosis fugax or recent TIA symptoms. There are no recent neurological changes noted. The patient denies history of DVT, PE or superficial thrombophlebitis. The patient denies recent episodes of angina or shortness of breath.   ABI's Rt=0.94 and Lt=0.71 (previous ABI's Rt=1.08 and Lt=0.79) Duplex [facility] of the lower extremity arterial system shows moderate stenosis of the left common femoral associated with distal disease  Current Meds  Medication Sig  . amLODipine (NORVASC) 5 MG tablet Take 5 mg by mouth daily.  . Ascorbic Acid (VITAMIN C) 1000 MG tablet 1,000 mg daily.  . Aspirin-Acetaminophen-Caffeine (GOODY HEADACHE PO) Take 1 packet by mouth daily as needed (Headache).  . Cholecalciferol (VITAMIN D3) 1000 units CAPS Take 1,000 Units by mouth daily.  . clopidogrel (PLAVIX) 75 MG tablet Take 75 mg by mouth daily.  . Cyanocobalamin 1000 MCG TBCR Take 1,000 mcg by mouth daily.  [place] lisinopril (PRINIVIL,ZESTRIL) 20 MG tablet Take 20 mg by mouth daily.  Marland Kitchen neomycin-polymyxin b-dexamethasone (MAXITROL) 3.5-10000-0.1 SUSP Place 1 drop into both eyes in the morning and at bedtime.  08-30-2002 omeprazole (PRILOSEC) 40 MG capsule Take 40 mg by mouth daily.  . pravastatin (PRAVACHOL) 40 MG tablet Take 40 mg by mouth at bedtime.  . propranolol (INDERAL) 40 MG  tablet Take 40 mg by mouth 2 (two) times daily.  . propranolol (INDERAL) 40 MG tablet Take 1 tablet by mouth 2 (two) times daily.  Marland Kitchen pyridoxine (B-6) 100 MG tablet Take 100 mg by mouth daily.  Marland Kitchen torsemide (DEMADEX) 10 MG tablet Take 10 mg by mouth daily.  . vitamin E 180 MG (400 UNITS) capsule Take 400 Units by mouth daily.    Past Medical History:  Diagnosis Date  . Allergy   . Arthritis   . Hypertension     Past Surgical History:  Procedure Laterality Date  . ABDOMINAL HYSTERECTOMY    . BREAST CYST ASPIRATION Right     Social History Social History   Tobacco Use  . Smoking status: Current Every Day Smoker  . Smokeless tobacco: Former Marland Kitchen    Types: Snuff, Chew  Substance Use Topics  . Alcohol use: No  . Drug use: No    Family History Family History  Problem Relation Age of Onset  . Breast cancer Mother 52    Allergies  Allergen Reactions  . Influenza Vaccines     Other reaction(s): Other (See Comments) Allergy to eggs  . Crab Extract Allergy Skin Test     Vomiting   . Eggs Or Egg-Derived Products Swelling    Vomiting, swelling of stomach per patient   . Shrimp (Diagnostic)     vomiting     REVIEW OF SYSTEMS (Negative unless checked)  Constitutional: [] Weight loss  [] Fever  [] Chills Cardiac: [] Chest pain   [] Chest pressure   [] Palpitations   [] Shortness of breath when laying flat   []   Shortness of breath with exertion. Vascular:  [x] Pain in legs with walking   [] Pain in legs at rest  [] History of DVT   [] Phlebitis   [] Swelling in legs   [] Varicose veins   [] Non-healing ulcers Pulmonary:   [] Uses home oxygen   [] Productive cough   [] Hemoptysis   [] Wheeze  [] COPD   [] Asthma Neurologic:  [] Dizziness   [] Seizures   [] History of stroke   [] History of TIA  [] Aphasia   [] Vissual changes   [] Weakness or numbness in arm   [] Weakness or numbness in leg Musculoskeletal:   [] Joint swelling   [x] Joint pain   [x] Low back pain Hematologic:  [] Easy bruising  [] Easy  bleeding   [] Hypercoagulable state   [] Anemic Gastrointestinal:  [] Diarrhea   [] Vomiting  [] Gastroesophageal reflux/heartburn   [] Difficulty swallowing. Genitourinary:  [] Chronic kidney disease   [] Difficult urination  [] Frequent urination   [] Blood in urine Skin:  [] Rashes   [] Ulcers  Psychological:  [] History of anxiety   []  History of major depression.  Physical Examination  There were no vitals filed for this visit. There is no height or weight on file to calculate BMI. Gen: WD/WN, NAD Head: [place]/AT, No temporalis wasting.  Ear/Nose/Throat: Hearing grossly intact, nares w/o erythema or drainage Eyes: PER, EOMI, sclera nonicteric.  Neck: Supple, no large masses.   Pulmonary:  Good air movement, no audible wheezing bilaterally, no use of accessory muscles.  Cardiac: RRR, no JVD Vascular:  Vessel Right Left  Radial Palpable Palpable  PT  not palpable  not palpable  DP  not palpable  not palpable  Gastrointestinal: Non-distended. No guarding/no peritoneal signs.  Musculoskeletal: M/S 5/5 throughout.  No deformity or atrophy.  Neurologic: CN 2-12 intact. Symmetrical.  Speech is fluent. Motor exam as listed above. Psychiatric: Judgment intact, Mood & affect appropriate for pt's clinical situation. Dermatologic: No rashes or ulcers noted.  No changes consistent with cellulitis.   CBC No results found for: WBC, HGB, HCT, MCV, PLT  BMET    Component Value Date/Time   BUN 15 10/17/2012 0805   CREATININE 1.16 10/17/2012 0805   GFRNONAA 51 (L) 10/17/2012 0805   GFRAA 60 (L) 10/17/2012 0805   CrCl cannot be calculated (Patient's most recent lab result is older than the maximum 21 days allowed.).  COAG No results found for: INR, PROTIME  Radiology No results found.   Assessment/Plan 1. Atherosclerosis of native artery of both lower extremities with intermittent claudication (HCC) Recommend:  The patient has experienced increased symptoms and is now describing lifestyle  limiting claudication and mild rest pain.  Given the severity of the patient's lower extremity symptoms the patient should undergo angiography and intervention.  Risk and benefits were reviewed the patient.  Indications for the procedure were reviewed.  All questions were answered, the patient agrees to proceed.   The patient should continue walking and begin a more formal exercise program.  The patient should continue antiplatelet therapy and aggressive treatment of the lipid abnormalities  The patient will follow up with me after the angiogram.   2. Essential hypertension Continue antihypertensive medications as already ordered, these medications have been reviewed and there are no changes at this time.   3. Lumbar and sacral osteoarthritis Continue NSAID medications as already ordered, these medications have been reviewed and there are no changes at this time.  Continued activity and therapy was stressed.   4. Mixed hyperlipidemia Continue statin as ordered and reviewed, no changes at this time   , MD  01/15/2020 9:00 AM

## 2020-02-05 ENCOUNTER — Inpatient Hospital Stay
Admission: EM | Admit: 2020-02-05 | Discharge: 2020-02-09 | DRG: 378 | Disposition: A | Discharge status: Home / Self Care | Payer: 59 | Attending: Internal Medicine | Admitting: Internal Medicine

## 2020-02-05 ENCOUNTER — Observation Stay: Payer: 59

## 2020-02-05 ENCOUNTER — Other Ambulatory Visit: Payer: Self-pay

## 2020-02-05 DIAGNOSIS — K5730 Diverticulosis of large intestine without perforation or abscess without bleeding: Secondary | ICD-10-CM | POA: Diagnosis present

## 2020-02-05 DIAGNOSIS — G44229 Chronic tension-type headache, not intractable: Secondary | ICD-10-CM | POA: Diagnosis not present

## 2020-02-05 DIAGNOSIS — E782 Mixed hyperlipidemia: Secondary | ICD-10-CM | POA: Diagnosis not present

## 2020-02-05 DIAGNOSIS — Z91013 Allergy to seafood: Secondary | ICD-10-CM

## 2020-02-05 DIAGNOSIS — E785 Hyperlipidemia, unspecified: Secondary | ICD-10-CM | POA: Diagnosis present

## 2020-02-05 DIAGNOSIS — N182 Chronic kidney disease, stage 2 (mild): Secondary | ICD-10-CM

## 2020-02-05 DIAGNOSIS — D500 Iron deficiency anemia secondary to blood loss (chronic): Secondary | ICD-10-CM | POA: Diagnosis present

## 2020-02-05 DIAGNOSIS — Z79899 Other long term (current) drug therapy: Secondary | ICD-10-CM

## 2020-02-05 DIAGNOSIS — E7800 Pure hypercholesterolemia, unspecified: Secondary | ICD-10-CM | POA: Diagnosis present

## 2020-02-05 DIAGNOSIS — Z888 Allergy status to other drugs, medicaments and biological substances status: Secondary | ICD-10-CM

## 2020-02-05 DIAGNOSIS — R519 Headache, unspecified: Secondary | ICD-10-CM | POA: Diagnosis present

## 2020-02-05 DIAGNOSIS — Z9049 Acquired absence of other specified parts of digestive tract: Secondary | ICD-10-CM

## 2020-02-05 DIAGNOSIS — K922 Gastrointestinal hemorrhage, unspecified: Principal | ICD-10-CM | POA: Diagnosis present

## 2020-02-05 DIAGNOSIS — K219 Gastro-esophageal reflux disease without esophagitis: Secondary | ICD-10-CM | POA: Diagnosis present

## 2020-02-05 DIAGNOSIS — I739 Peripheral vascular disease, unspecified: Secondary | ICD-10-CM

## 2020-02-05 DIAGNOSIS — I10 Essential (primary) hypertension: Secondary | ICD-10-CM | POA: Diagnosis present

## 2020-02-05 DIAGNOSIS — F5089 Other specified eating disorder: Secondary | ICD-10-CM

## 2020-02-05 DIAGNOSIS — Z720 Tobacco use: Secondary | ICD-10-CM

## 2020-02-05 DIAGNOSIS — Z6823 Body mass index (BMI) 23.0-23.9, adult: Secondary | ICD-10-CM

## 2020-02-05 DIAGNOSIS — G8929 Other chronic pain: Secondary | ICD-10-CM | POA: Diagnosis present

## 2020-02-05 DIAGNOSIS — F17209 Nicotine dependence, unspecified, with unspecified nicotine-induced disorders: Secondary | ICD-10-CM | POA: Diagnosis present

## 2020-02-05 DIAGNOSIS — K31819 Angiodysplasia of stomach and duodenum without bleeding: Secondary | ICD-10-CM

## 2020-02-05 DIAGNOSIS — N1831 Chronic kidney disease, stage 3a: Secondary | ICD-10-CM | POA: Diagnosis present

## 2020-02-05 DIAGNOSIS — D649 Anemia, unspecified: Secondary | ICD-10-CM | POA: Diagnosis present

## 2020-02-05 DIAGNOSIS — I70219 Atherosclerosis of native arteries of extremities with intermittent claudication, unspecified extremity: Secondary | ICD-10-CM | POA: Diagnosis present

## 2020-02-05 DIAGNOSIS — K5520 Angiodysplasia of colon without hemorrhage: Secondary | ICD-10-CM

## 2020-02-05 DIAGNOSIS — Z20822 Contact with and (suspected) exposure to covid-19: Secondary | ICD-10-CM | POA: Diagnosis present

## 2020-02-05 DIAGNOSIS — Z91012 Allergy to eggs: Secondary | ICD-10-CM

## 2020-02-05 DIAGNOSIS — R0602 Shortness of breath: Secondary | ICD-10-CM | POA: Diagnosis not present

## 2020-02-05 DIAGNOSIS — F17210 Nicotine dependence, cigarettes, uncomplicated: Secondary | ICD-10-CM | POA: Diagnosis present

## 2020-02-05 DIAGNOSIS — N179 Acute kidney failure, unspecified: Secondary | ICD-10-CM | POA: Diagnosis present

## 2020-02-05 DIAGNOSIS — I129 Hypertensive chronic kidney disease with stage 1 through stage 4 chronic kidney disease, or unspecified chronic kidney disease: Secondary | ICD-10-CM | POA: Diagnosis present

## 2020-02-05 DIAGNOSIS — I70211 Atherosclerosis of native arteries of extremities with intermittent claudication, right leg: Secondary | ICD-10-CM | POA: Diagnosis present

## 2020-02-05 DIAGNOSIS — Z7902 Long term (current) use of antithrombotics/antiplatelets: Secondary | ICD-10-CM

## 2020-02-05 HISTORY — DX: Peripheral vascular disease, unspecified: I73.9

## 2020-02-05 LAB — IRON AND TIBC
Iron: 7 ug/dL — ABNORMAL LOW (ref 28–170)
Saturation Ratios: 1 % — ABNORMAL LOW (ref 10.4–31.8)
TIBC: 627 ug/dL — ABNORMAL HIGH (ref 250–450)
UIBC: 620 ug/dL

## 2020-02-05 LAB — CBC
HCT: 13 % — CL (ref 36.0–46.0)
Hemoglobin: 3.7 g/dL — CL (ref 12.0–15.0)
MCH: 16.1 pg — ABNORMAL LOW (ref 26.0–34.0)
MCHC: 28.5 g/dL — ABNORMAL LOW (ref 30.0–36.0)
MCV: 56.5 fL — ABNORMAL LOW (ref 80.0–100.0)
Platelets: 560 10*3/uL — ABNORMAL HIGH (ref 150–400)
RBC: 2.3 MIL/uL — ABNORMAL LOW (ref 3.87–5.11)
RDW: 21.2 % — ABNORMAL HIGH (ref 11.5–15.5)
WBC: 7.4 10*3/uL (ref 4.0–10.5)
nRBC: 1.2 % — ABNORMAL HIGH (ref 0.0–0.2)

## 2020-02-05 LAB — ABO/RH: ABO/RH(D): B POS

## 2020-02-05 LAB — BASIC METABOLIC PANEL
Anion gap: 11 (ref 5–15)
BUN: 17 mg/dL (ref 8–23)
CO2: 25 mmol/L (ref 22–32)
Calcium: 9.5 mg/dL (ref 8.9–10.3)
Chloride: 96 mmol/L — ABNORMAL LOW (ref 98–111)
Creatinine, Ser: 1.25 mg/dL — ABNORMAL HIGH (ref 0.44–1.00)
GFR, Estimated: 48 mL/min — ABNORMAL LOW (ref >60)
Glucose, Bld: 102 mg/dL — ABNORMAL HIGH (ref 70–99)
Potassium: 3.9 mmol/L (ref 3.5–5.1)
Sodium: 132 mmol/L — ABNORMAL LOW (ref 135–145)

## 2020-02-05 LAB — VITAMIN B12: Vitamin B-12: 4084 pg/mL — ABNORMAL HIGH (ref 180–914)

## 2020-02-05 LAB — RETICULOCYTES
Immature Retic Fract: 27.4 % — ABNORMAL HIGH (ref 2.3–15.9)
RBC.: 2.34 MIL/uL — ABNORMAL LOW (ref 3.87–5.11)
Retic Count, Absolute: 51 10*3/uL (ref 19.0–186.0)
Retic Ct Pct: 2.2 % (ref 0.4–3.1)

## 2020-02-05 LAB — PROTIME-INR
INR: 1.1 (ref 0.8–1.2)
Prothrombin Time: 13.4 seconds (ref 11.4–15.2)

## 2020-02-05 LAB — PREPARE RBC (CROSSMATCH)

## 2020-02-05 LAB — TROPONIN I (HIGH SENSITIVITY): Troponin I (High Sensitivity): 7 ng/L (ref <18)

## 2020-02-05 LAB — FERRITIN: Ferritin: 3 ng/mL — ABNORMAL LOW (ref 11–307)

## 2020-02-05 LAB — FOLATE: Folate: 4.6 ng/mL — ABNORMAL LOW (ref >5.9)

## 2020-02-05 LAB — APTT: aPTT: 29 seconds (ref 24–36)

## 2020-02-05 MED ORDER — PRAVASTATIN SODIUM 20 MG PO TABS
40.0000 mg | ORAL_TABLET | Freq: Every day | ORAL | Status: DC
  Administered 2020-02-05 – 2020-02-08 (×4): 40 mg via ORAL
  Filled 2020-02-05: qty 2
  Filled 2020-02-05: qty 1
  Filled 2020-02-05: qty 2
  Filled 2020-02-05: qty 1
  Filled 2020-02-05: qty 2

## 2020-02-05 MED ORDER — SODIUM CHLORIDE 0.9 % IV SOLN
80.0000 mg | Freq: Two times a day (BID) | INTRAVENOUS | Status: DC
  Administered 2020-02-05 – 2020-02-09 (×8): 80 mg via INTRAVENOUS
  Filled 2020-02-05 (×15): qty 80

## 2020-02-05 MED ORDER — PROPRANOLOL HCL 20 MG PO TABS: 40.0000 mg | ORAL_TABLET | Freq: Two times a day (BID) | ORAL | Status: DC

## 2020-02-05 MED ORDER — METOPROLOL TARTRATE 5 MG/5ML IV SOLN: 5.0000 mg | INTRAVENOUS | Status: DC | PRN

## 2020-02-05 MED ORDER — TRAZODONE HCL 50 MG PO TABS
50.0000 mg | ORAL_TABLET | Freq: Every evening | ORAL | Status: DC | PRN
Start: 2020-02-05 — End: 2020-02-07
  Administered 2020-02-05 – 2020-02-06 (×2): 50 mg via ORAL
  Filled 2020-02-05 (×2): qty 1

## 2020-02-05 MED ORDER — ONDANSETRON HCL 4 MG/2ML IJ SOLN: 4.0000 mg | Freq: Four times a day (QID) | INTRAMUSCULAR | Status: DC | PRN

## 2020-02-05 MED ORDER — PROPRANOLOL HCL 20 MG PO TABS
40.0000 mg | ORAL_TABLET | Freq: Two times a day (BID) | ORAL | Status: DC
  Administered 2020-02-06 – 2020-02-09 (×6): 40 mg via ORAL
  Filled 2020-02-05 (×3): qty 2
  Filled 2020-02-05: qty 1
  Filled 2020-02-05 (×2): qty 2
  Filled 2020-02-05: qty 1
  Filled 2020-02-05: qty 2

## 2020-02-05 MED ORDER — ACETAMINOPHEN 325 MG PO TABS
325.0000 mg | ORAL_TABLET | Freq: Four times a day (QID) | ORAL | Status: AC | PRN
  Administered 2020-02-06 (×3): 325 mg via ORAL
  Filled 2020-02-05 (×3): qty 1

## 2020-02-05 MED ORDER — ONDANSETRON HCL 4 MG PO TABS: 4.0000 mg | ORAL_TABLET | Freq: Four times a day (QID) | ORAL | Status: DC | PRN

## 2020-02-05 MED ORDER — AMLODIPINE BESYLATE 5 MG PO TABS
5.0000 mg | ORAL_TABLET | Freq: Every day | ORAL | Status: DC
Start: 2020-02-06 — End: 2020-02-09
  Administered 2020-02-06 – 2020-02-09 (×4): 5 mg via ORAL
  Filled 2020-02-05 (×4): qty 1

## 2020-02-05 MED ORDER — PEG 3350-KCL-NA BICARB-NACL 420 G PO SOLR
4000.0000 mL | Freq: Once | ORAL | Status: AC
  Administered 2020-02-05: 4000 mL via ORAL
  Filled 2020-02-05: qty 4000

## 2020-02-05 MED ORDER — SODIUM CHLORIDE 0.9% IV SOLUTION
Freq: Once | INTRAVENOUS | Status: AC
  Filled 2020-02-05: qty 250

## 2020-02-05 MED ORDER — ACETAMINOPHEN 650 MG RE SUPP: 325.0000 mg | Freq: Four times a day (QID) | RECTAL | Status: AC | PRN

## 2020-02-05 NOTE — ED Triage Notes (Signed)
Pt to ED POV for chief complaint of low hgb 3.7 sent from [facility]. States no active bleeding. Color WDL.  Denies shob. NAD noted

## 2020-02-05 NOTE — ED Provider Notes (Addendum)
[facility] Emergency Department Provider Note   ____________________________________________    I have reviewed the triage vital signs and the nursing notes.   HISTORY  Chief Complaint low hemoglobin     HPI Gina Nguyen is a 65 y.o. female with history as below who presents with complaints of abnormal lab value.  Patient reports she went to [place] [facility] this morning because she has been having acid reflux issues, she also described to them that she was having fatigue and tiredness and so they obtained blood work.  They called her notified her of a hemoglobin of 3.7.  Patient reports her stools have been brown and normal, she denies a history of anemia.  She does admit to feeling fatigued and mildly short of breath with exertion recently.  No chest pain  Past Medical History:  Diagnosis Date  . Allergy   . Arthritis   . Hypercholesterolemia   . Hypertension     Patient Active Problem List   Diagnosis Date Noted  . Atherosclerosis of native arteries of extremity with intermittent claudication (HCC) 09/27/2016  . Leg pain 09/27/2016  . Lumbar and sacral osteoarthritis 09/27/2016  . Essential hypertension 09/27/2016  . Hyperlipidemia 09/27/2016  . Health care maintenance 04/18/2014  . Atherosclerotic PVD with intermittent claudication (HCC) 09/22/2013  . Chronic headaches 09/22/2013  . Increased frequency of urination 09/22/2013  . Peripheral neuropathy 09/22/2013  . Benign hypertension with chronic kidney disease, stage III (HCC) 09/22/2013  . Hypercholesterolemia 09/22/2013    Past Surgical History:  Procedure Laterality Date  . ABDOMINAL HYSTERECTOMY    . BREAST CYST ASPIRATION Right   . LOWER EXTREMITY ANGIOGRAPHY Left 01/15/2020   Procedure: LOWER EXTREMITY ANGIOGRAPHY;  Surgeon: Renford Dills, MD;  Location: [facility] INVASIVE CV LAB;  Service: Cardiovascular;  Laterality: Left;    Prior to Admission medications   Medication Sig  Start Date End Date Taking? Authorizing Provider  amLODipine (NORVASC) 5 MG tablet Take 5 mg by mouth daily. 06/16/16   [provider]  Ascorbic Acid (VITAMIN C) 1000 MG tablet 1,000 mg daily.    [provider]  Aspirin-Acetaminophen-Caffeine (GOODY HEADACHE PO) Take 1 packet by mouth daily as needed (Headache).    [provider]  Cholecalciferol (VITAMIN D3) 1000 units CAPS Take 1,000 Units by mouth daily.    [provider]  clopidogrel (PLAVIX) 75 MG tablet Take 75 mg by mouth daily. 05/17/16   [provider]  Cyanocobalamin 1000 MCG TBCR Take 1,000 mcg by mouth daily.    [provider]  lisinopril (PRINIVIL,ZESTRIL) 20 MG tablet Take 20 mg by mouth daily. 01/23/16   [provider]  neomycin-polymyxin b-dexamethasone (MAXITROL) 3.5-10000-0.1 SUSP Place 1 drop into both eyes in the morning and at bedtime. 10/19/19   [provider]  omeprazole (PRILOSEC) 40 MG capsule Take 40 mg by mouth daily. 05/17/16   [provider]  pravastatin (PRAVACHOL) 40 MG tablet Take 40 mg by mouth at bedtime. 05/17/16   [provider]  propranolol (INDERAL) 40 MG tablet Take 40 mg by mouth 2 (two) times daily. 08/30/16   [provider]  propranolol (INDERAL) 40 MG tablet Take 1 tablet by mouth 2 (two) times daily. 04/26/19   [provider]  pyridoxine (B-6) 100 MG tablet Take 100 mg by mouth daily.    [provider]  torsemide (DEMADEX) 10 MG tablet Take 10 mg by mouth daily. 08/23/16   [provider]  vitamin  E 180 MG (400 UNITS) capsule Take 400 Units by mouth daily.    [provider]     Allergies Influenza vaccines, Crab extract allergy skin test, Eggs or egg-derived products, and Shrimp (diagnostic)  Family History  Problem Relation Age of Onset  . Breast cancer Mother 41    Social History Social History   Tobacco Use  . Smoking status: Current Every Day Smoker   . Smokeless tobacco: Former Neurosurgeon    Types: Snuff, Chew  Substance Use Topics  . Alcohol use: No  . Drug use: No    Review of Systems  Constitutional: No fever/chills Eyes: No visual changes.  ENT: No sore throat. Cardiovascular: Denies chest pain. Respiratory: Denies shortness of breath. Gastrointestinal: No abdominal pain.  No nausea, no vomiting.  Stools as above Genitourinary: Negative for dysuria. Musculoskeletal: Negative for back pain. Skin: Negative for rash. Neurological: Negative for headaches or weakness   ____________________________________________   PHYSICAL EXAM:  VITAL SIGNS: ED Triage Vitals [02/05/20 1149]  Enc Vitals Group     BP 126/60     Pulse Rate 85     Resp 20     Temp 98 F (36.7 C)     Temp Source Oral     SpO2 100 %     Weight 64.4 kg (142 lb)     Height 1.651 m (5\' 5" )     Head Circumference      Peak Flow      Pain Score 0     Pain Loc      Pain Edu?      Excl. in GC?     Constitutional: Alert and oriented.   Nose: No congestion/rhinnorhea. Mouth/Throat: Mucous membranes are moist.   Neck:  Painless ROM Cardiovascular: Normal rate, regular rhythm. Grossly normal heart sounds.  Good peripheral circulation. Respiratory: Normal respiratory effort.  No retractions. Lungs CTAB. Gastrointestinal: Soft and nontender. No distention.  No CVA tenderness.  Musculoskeletal: No lower extremity tenderness nor edema.  Warm and well perfused Neurologic:  Normal speech and language. No gross focal neurologic deficits are appreciated.  Skin:  Skin is warm, dry and intact. No rash noted. Psychiatric: Mood and affect are normal. Speech and behavior are normal.  ____________________________________________   LABS (all labs ordered are listed, but only abnormal results are displayed)  Labs Reviewed  BASIC METABOLIC PANEL - Abnormal; Notable for the following components:      Result Value   Sodium 132 (*)    Chloride 96 (*)    Glucose, Bld  102 (*)    Creatinine, Ser 1.25 (*)    GFR, Estimated 48 (*)    All other components within normal limits  CBC - Abnormal; Notable for the following components:   RBC 2.30 (*)    Hemoglobin 3.7 (*)    HCT 13.0 (*)    MCV 56.5 (*)    MCH 16.1 (*)    MCHC 28.5 (*)    RDW 21.2 (*)    Platelets 560 (*)    nRBC 1.2 (*)    All other components within normal limits  APTT  PROTIME-INR  TYPE AND SCREEN  PREPARE RBC (CROSSMATCH)  ABO/RH  TROPONIN I (HIGH SENSITIVITY)   ____________________________________________  EKG ED ECG REPORT I, , the attending physician, personally viewed and interpreted this ECG.  Date: 02/05/2020  Rhythm: normal sinus rhythm QRS Axis: normal Intervals: normal ST/T Wave abnormalities: normal Narrative Interpretation: no evidence of acute ischemia   ____________________________________________  RADIOLOGY  None ____________________________________________   PROCEDURES  Procedure(s) performed: No  Procedures   Critical Care performed: tyes  CRITICAL CARE Performed by: Jene Every   Total critical care time: 30 minutes  Critical care time was exclusive of separately billable procedures and treating other patients.  Critical care was necessary to treat or prevent imminent or life-threatening deterioration.  Critical care was time spent personally by me on the following activities: development of treatment plan with patient and/or surrogate as well as nursing, discussions with consultants, evaluation of patient's response to treatment, examination of patient, obtaining history from patient or surrogate, ordering and performing treatments and interventions, ordering and review of laboratory studies, ordering and review of radiographic studies, pulse oximetry and re-evaluation of patient's condition.  ____________________________________________   INITIAL IMPRESSION / ASSESSMENT AND PLAN / ED COURSE  Pertinent labs & imaging  results that were available during my care of the patient were reviewed by me and considered in my medical decision making (see chart for details).  Patient presents with fatigue, weakness, shortness of breath with exertion as noted above found to have hemoglobin of 3.7 at outside facility, will recheck hemoglobin here.  She reports normal stools, possibility of severe iron deficiency.  Will perform guaiac test.  If verified to be low, will transfuse the patient, she has consented. Have ordered 2 units PRBC.  Patient has brown stool, guiac positive on test  She will require admission    ____________________________________________   FINAL CLINICAL IMPRESSION(S) / ED DIAGNOSES  Final diagnoses:  Symptomatic anemia        Note:  This document was prepared using Dragon voice recognition software and may include unintentional dictation errors.   Jene Every, MD 02/05/20 1301    Jene Every, MD 02/05/20 1351

## 2020-02-05 NOTE — ED Triage Notes (Addendum)
First Nurse NOte:  Patient sent to ED for low HGB.  States had blood drawn today.  Per care everywhere, H/H:  3.7 / 13.2  AAOx3.  Skin warm and dry. NAD

## 2020-02-05 NOTE — H&P (Addendum)
History and Physical   BELLAMY RUBEY VOZ:[id number] DOB: 05/21/1955 DOA: 02/05/2020  PCP: Lauro Regulus, MD  Outpatient Specialists: Dr. Gilda Crease, vascular Patient coming from: home  I have personally briefly reviewed patient's old medical records in [facility] EMR.  Chief Concern: abnormal labs  HPI: Gina Nguyen is a 65 y.o. female with medical history significant for menorrhagia s/p hysterectomy, ectopic pregnancy before hysterectomy, appendectomy, bowel obstruction s/p colectomy , large ovarian cyst s/p oophectomy, hypertension, atherosclerotic occlusion in the bilateral lower extremity with lifestyle limiting claudication, status post abdominal aortogram, left lower extremity distal runoff third order catheter placement, Star close right common femoral artery presented today from PCP for abnormal labs.   She had presented to her CP for chief concerns of acid reflux and generalized weakness. She endorses dyspnea with exertion and fatigue for about 2 weeks.  She reports her last bowel movement was Sunday, 02/03/2020 and it was loose and normal. She endorses that she is very careful to not get constipation as she has been told that she needs to never allow herself to get constipated due to multiple abdominal surgery.   She takes Goody powder (2 packs per day) every day. She has been doing this for 'years'. Per patient, greater than 10 years. She denies nausea and vomiting. She presented to [facility] for burning in her chest and burning taste in her mouth after food intake.   Social history: She lives with her spouse and is retired.  She formerly smoked tobacco products and quit about 20 years ago.  She denies frequent or regular EtOH use.  She states that her last drink was 2 months ago.  She denies recreational drug use.  ROS: Constitutional: no weight change, no fever ENT/Mouth: no sore throat, no rhinorrhea Eyes: no eye pain, no vision changes Cardiovascular: no chest pain, + dyspnea,   no edema, no palpitations Respiratory: no cough, no sputum, no wheezing Gastrointestinal: no nausea, no vomiting, no diarrhea, no constipation Genitourinary: no urinary incontinence, no dysuria, no hematuria Musculoskeletal: no arthralgias, no myalgias Skin: no skin lesions, no pruritus, Neuro: + weakness, no loss of consciousness, no syncope Psych: no anxiety, no depression, + decrease appetite Heme/Lymph: no bruising, no bleeding  ED Course: Discussed with ED provider, patient requiring hospitalization due to severe anemia.  Assessment/Plan  Active Problems:   Essential hypertension   Severe anemia   AKI (acute kidney injury) (HCC)   Severe anemia -patient is currently hemodynamically stable and not in any acute distress -ED provider ordered 2 units of packed red blood cells -Type and cross -GI has been consulted, Dr. Servando Snare  -Given patient's history of chronic tobacco use since the age of 46, carcinoma cannot be excluded -Anemia panel -Protonix 80 mg IVP twice daily -Repeat CBC at approximately 2000 and will order more PRBC as needed -Admit to progressive cardiac unit with telemetry, observation -N.p.o. at midnight in anticipation of endoscopy on 02/06/2020  Peripheral artery disease status post abdominal aortogram, left lower extremity distal catheter placement, right femoral artery catheter placement on 01/15/2020 -On physical exam patient did not have any extremity or abdominal discomfort, swelling, ecchymosis -Low clinical suspicion for vascular bleeding at this time  Hypertension-resumed home amlodipine 5 mg daily, propranolol 40 mg twice daily Hyperlipidemia-resumed home pravastatin 40 mg nightly Insomnia-resumed home trazodone 50 mg p.o. nightly as needed  Chart reviewed.   01/15/2020: None history of atherosclerotic occlusive disease of the bilateral lower extremity with lifestyle limiting claudication status post abdominal aortogram and  left lower extremity distal runoff  third order catheter placement, StarClose right femoral artery  DVT prophylaxis: ted hose Code Status: full code Diet: npo at midnight Family Communication: updated spouse at bedside Disposition Plan: pending clinical course Consults called: GI Admission status: progressive cardiac   Past Medical History:  Diagnosis Date  . Allergy   . Arthritis   . Hypercholesterolemia   . Hypertension    Past Surgical History:  Procedure Laterality Date  . ABDOMINAL HYSTERECTOMY    . BREAST CYST ASPIRATION Right   . LOWER EXTREMITY ANGIOGRAPHY Left 01/15/2020   Procedure: LOWER EXTREMITY ANGIOGRAPHY;  Surgeon: Renford Dills, MD;  Location: [facility] INVASIVE CV LAB;  Service: Cardiovascular;  Laterality: Left;   Social History:  reports that she has been smoking. She has quit using smokeless tobacco.  Her smokeless tobacco use included snuff and chew. She reports that she does not drink alcohol and does not use drugs.  Allergies  Allergen Reactions  . Influenza Vaccines     Other reaction(s): Other (See Comments) Allergy to eggs  . Crab Extract Allergy Skin Test     Vomiting   . Eggs Or Egg-Derived Products Swelling    Vomiting, swelling of stomach per patient   . Shrimp (Diagnostic)     vomiting   Family History  Problem Relation Age of Onset  . Breast cancer Mother 70   Family history: Family history reviewed and not pertinent  Prior to Admission medications   Medication Sig Start Date End Date Taking? Authorizing Provider  amLODipine (NORVASC) 5 MG tablet Take 5 mg by mouth daily. 06/16/16   [provider]  Ascorbic Acid (VITAMIN C) 1000 MG tablet 1,000 mg daily.    [provider]  Aspirin-Acetaminophen-Caffeine (GOODY HEADACHE PO) Take 1 packet by mouth daily as needed (Headache).    [provider]  Cholecalciferol (VITAMIN D3) 1000 units CAPS Take 1,000 Units by mouth daily.    [provider]  clopidogrel (PLAVIX) 75 MG tablet Take 75 mg  by mouth daily. 05/17/16   [provider]  Cyanocobalamin 1000 MCG TBCR Take 1,000 mcg by mouth daily.    [provider]  lisinopril (PRINIVIL,ZESTRIL) 20 MG tablet Take 20 mg by mouth daily. 01/23/16   [provider]  neomycin-polymyxin b-dexamethasone (MAXITROL) 3.5-10000-0.1 SUSP Place 1 drop into both eyes in the morning and at bedtime. 10/19/19   [provider]  omeprazole (PRILOSEC) 40 MG capsule Take 40 mg by mouth daily. 05/17/16   [provider]  pravastatin (PRAVACHOL) 40 MG tablet Take 40 mg by mouth at bedtime. 05/17/16   [provider]  propranolol (INDERAL) 40 MG tablet Take 40 mg by mouth 2 (two) times daily. 08/30/16   [provider]  propranolol (INDERAL) 40 MG tablet Take 1 tablet by mouth 2 (two) times daily. 04/26/19   [provider]  pyridoxine (B-6) 100 MG tablet Take 100 mg by mouth daily.    [provider]  torsemide (DEMADEX) 10 MG tablet Take 10 mg by mouth daily. 08/23/16   [provider]  vitamin E 180 MG (400 UNITS) capsule Take 400 Units by mouth daily.    [provider]   Physical Exam: Vitals:   02/05/20 1149 02/05/20 1230  BP: 126/60 131/74  Pulse: 85 81  Resp: 20 17  Temp: 98 F (36.7 C)   TempSrc: Oral   SpO2: 100% 100%  Weight: 64.4 kg   Height: 5\' 5"  (1.651 m)  Constitutional: appears age appropriate, NAD, calm, comfortable Eyes: PERRL, lids and conjunctivae normal ENMT: Mucous membranes are moist. Posterior pharynx clear of any exudate or lesions. Age-appropriate dentition. Hearing appropriate Neck: normal, supple, no masses, no thyromegaly Respiratory: clear to auscultation bilaterally, no wheezing, no crackles. Normal respiratory effort. No accessory muscle use.  Cardiovascular: Regular rate and rhythm, no murmurs / rubs / gallops. No extremity edema. 2+ pedal pulses. No carotid bruits.  Abdomen: no tenderness, no masses palpated, no  hepatosplenomegaly. Bowel sounds positive.  Musculoskeletal: no clubbing / cyanosis. No joint deformity upper and lower extremities. Good ROM, no contractures, no atrophy. Normal muscle tone.  Skin: no rashes, lesions, ulcers. No induration Neurologic: Sensation intact. Strength 5/5 in all 4.  Psychiatric: Normal judgment and insight. Alert and oriented x 3. Normal mood.   EKG: independently reviewed, showing normal sinus rhythm with rate of 85, qtc 485  Chest x-ray on Admission: I personally reviewed and I agree with radiologist reading as below.  No results found.  Labs on Admission: I have personally reviewed following labs  CBC: Recent Labs  Lab 02/05/20 1204  WBC 7.4  HGB 3.7*  HCT 13.0*  MCV 56.5*  PLT 560*   Basic Metabolic Panel: Recent Labs  Lab 02/05/20 1204  NA 132*  K 3.9  CL 96*  CO2 25  GLUCOSE 102*  BUN 17  CREATININE 1.25*  CALCIUM 9.5   GFR: Estimated Creatinine Clearance: 39.8 mL/min (A) (by C-G formula based on SCr of 1.25 mg/dL (H)).  Coagulation Profile: Recent Labs  Lab 02/05/20 1204  INR 1.1   CRITICAL CARE Performed by: Nadyne Coombes Samanthamarie Ezzell  Total critical care time: 40 minutes  Critical care time was exclusive of separately billable procedures and treating other patients.  Critical care was necessary to treat or prevent imminent or life-threatening deterioration.  Critical care was time spent personally by me on the following activities: development of treatment plan with patient and/or surrogate as well as nursing, discussions with consultants, evaluation of patient's response to treatment, examination of patient, obtaining history from patient or surrogate, ordering and performing treatments and interventions, ordering and review of laboratory studies, ordering and review of radiographic studies, pulse oximetry and re-evaluation of patient's condition.  Jaella Weinert N Everest Hacking D.O. [facility]  If 7PM-7AM, please contact overnight-coverage  provider If 7AM-7PM, please contact day coverage provider [email or web address]  02/05/2020, 1:56 PM

## 2020-02-05 NOTE — Consult Note (Signed)
Midge Minium, MD [facility]  [address], [address] [place], [place] [address] Phone: [phone] Fax : [phone]  Consultation  Referring Provider:     Dr. Sedalia Muta Primary Care Physician:  Lauro Regulus, MD Primary Gastroenterologist:  [facility]         Reason for Consultation:     Iron deficiency anemia  Date of Admission:  02/05/2020 Date of Consultation:  02/05/2020         HPI:   Gina Nguyen is a 65 y.o. female Who was seen by her primary care provider for feeling weak and tired.  The patient had lab work done and was called and told to go to the emergency room.  The patient it emergency room had a repeat hemoglobin that was shown to be low at 3.7.  The patient states that she takes [place] powders every day. She also reports that she had seen her primary care provider for heartburn and the feeling that food sticks in her chest when she eats.  The patient denies any black stools or bloody stools.  She also denies any abdominal pain or unexplained weight loss.  The patient appears to have had a colonoscopy in 2017.  Those results are not available since they were done at [facility].  The patient denies ever having an upper endoscopy.  The patient had iron studies which showed an iron of 7 with a saturation of 1%.  The patient also had a ferritin checked today which was 3. The patient had a negative COVID test on January 7 and now has a Covid test pending.  I'm now being asked to see the patient for iron deficiency anemia.  Past Medical History:  Diagnosis Date  . Allergy   . Arthritis   . Hypercholesterolemia   . Hypertension     Past Surgical History:  Procedure Laterality Date  . ABDOMINAL HYSTERECTOMY    . BREAST CYST ASPIRATION Right   . LOWER EXTREMITY ANGIOGRAPHY Left 01/15/2020   Procedure: LOWER EXTREMITY ANGIOGRAPHY;  Surgeon: Renford Dills, MD;  Location: [facility] INVASIVE CV LAB;  Service: Cardiovascular;  Laterality: Left;    Prior to Admission  medications   Medication Sig Start Date End Date Taking? Authorizing Provider  amLODipine (NORVASC) 5 MG tablet Take 5 mg by mouth daily. 06/16/16   [provider]  Ascorbic Acid (VITAMIN C) 1000 MG tablet 1,000 mg daily.    [provider]  Aspirin-Acetaminophen-Caffeine (GOODY HEADACHE PO) Take 1 packet by mouth daily as needed (Headache).    [provider]  Cholecalciferol (VITAMIN D3) 1000 units CAPS Take 1,000 Units by mouth daily.    [provider]  clopidogrel (PLAVIX) 75 MG tablet Take 75 mg by mouth daily. 05/17/16   [provider]  Cyanocobalamin 1000 MCG TBCR Take 1,000 mcg by mouth daily.    [provider]  lisinopril (PRINIVIL,ZESTRIL) 20 MG tablet Take 20 mg by mouth daily. 01/23/16   [provider]  neomycin-polymyxin b-dexamethasone (MAXITROL) 3.5-10000-0.1 SUSP Place 1 drop into both eyes in the morning and at bedtime. 10/19/19   [provider]  omeprazole (PRILOSEC) 40 MG capsule Take 40 mg by mouth daily. 05/17/16   [provider]  pravastatin (PRAVACHOL) 40 MG tablet Take 40 mg by mouth at bedtime. 05/17/16   [provider]  propranolol (INDERAL) 40 MG tablet Take 40 mg by mouth 2 (two) times daily. 08/30/16   [provider]  propranolol (INDERAL) 40  MG tablet Take 1 tablet by mouth 2 (two) times daily. 04/26/19   [provider]  pyridoxine (B-6) 100 MG tablet Take 100 mg by mouth daily.    [provider]  torsemide (DEMADEX) 10 MG tablet Take 10 mg by mouth daily. 08/23/16   [provider]  vitamin E 180 MG (400 UNITS) capsule Take 400 Units by mouth daily.    [provider]    Family History  Problem Relation Age of Onset  . Breast cancer Mother 94     Social History   Tobacco Use  . Smoking status: Current Every Day Smoker  . Smokeless tobacco: Former Neurosurgeon    Types: Snuff, Chew  Substance Use Topics  . Alcohol use: No  .  Drug use: No    Allergies as of 02/05/2020 - Review Complete 02/05/2020  Allergen Reaction Noted  . Influenza vaccines  10/26/2018  . Crab extract allergy skin test  01/15/2020  . Eggs or egg-derived products Swelling 11/29/2016  . Shrimp (diagnostic)  01/15/2020    Review of Systems:    All systems reviewed and negative except where noted in HPI.   Physical Exam:  Vital signs in last 24 hours: Temp:  [97.9 F (36.6 C)-98.3 F (36.8 C)] 98.1 F (36.7 C) (02/01 1445) Pulse Rate:  [81-89] 82 (02/01 1600) Resp:  [14-20] 14 (02/01 1445) BP: (126-160)/(60-86) 150/82 (02/01 1600) SpO2:  [100 %] 100 % (02/01 1600) Weight:  [64.4 kg] 64.4 kg (02/01 1149)   General:   Pleasant, cooperative in NAD Head:  Normocephalic and atraumatic. Eyes:   No icterus.   Conjunctiva pink. PERRLA. Ears:  Normal auditory acuity. Neck:  Supple; no masses or thyroidomegaly Lungs: Respirations even and unlabored. Lungs clear to auscultation bilaterally.   No wheezes, crackles, or rhonchi.  Heart:  Regular rate and rhythm;  Without murmur, clicks, rubs or gallops Abdomen:  Soft, nondistended, nontender. Normal bowel sounds. No appreciable masses or hepatomegaly.  No rebound or guarding.  Rectal:  Not performed. Msk:  Symmetrical without gross deformities.    Extremities:  Without edema, cyanosis or clubbing. Neurologic:  Alert and oriented x3;  grossly normal neurologically. Skin:  Intact without significant lesions or rashes. Cervical Nodes:  No significant cervical adenopathy. Psych:  Alert and cooperative. Normal affect.  LAB RESULTS: Recent Labs    02/05/20 1204  WBC 7.4  HGB 3.7*  HCT 13.0*  PLT 560*   BMET Recent Labs    02/05/20 1204  NA 132*  K 3.9  CL 96*  CO2 25  GLUCOSE 102*  BUN 17  CREATININE 1.25*  CALCIUM 9.5   LFT No results for input(s): PROT, ALBUMIN, AST, ALT, ALKPHOS, BILITOT, BILIDIR, IBILI in the last 72 hours. PT/INR Recent Labs    02/05/20 1204  LABPROT  13.4  INR 1.1    STUDIES: DG Chest Port 1 View  Result Date: 02/05/2020 CLINICAL DATA:  Shortness of breath. EXAM: PORTABLE CHEST 1 VIEW COMPARISON:  None. FINDINGS: The heart size and mediastinal contours are within normal limits. Both lungs are clear. The visualized skeletal structures are unremarkable. IMPRESSION: No active disease. Electronically Signed   By: Obie Dredge M.D.   On: 02/05/2020 14:24      Impression / Plan:   Assessment: Active Problems:   Atherosclerosis of native arteries of extremity with intermittent claudication (HCC)   Essential hypertension   Hyperlipidemia   Chronic headaches   Hypercholesterolemia   Severe anemia   AKI (acute  kidney injury) (HCC)   Tobacco use disorder, continuous   Gina Nguyen is a 65 y.o. y/o female with a history of heartburn and was seen by her primary care provider.  The patient states she was started on medication for heartburn and was feeling weak so she had blood work done which showed her to be profoundly anemic.  The patient was told to go to the ER.  The patient was found to have a hemoglobin of 3.7. She has no overt signs of bleeding but does take Goody powders and has a low iron and a low iron saturation.  Plan:  The patient will be started on a clear liquid diet and will be set up for an EGD and colonoscopy for tomorrow.  The patient will need to be transfused to above 7 prior to safely undergoing any endoscopic evaluation. The patient has been explained the plan and agrees with it.  PPI IV twice daily  Continue serial CBCs and transfuse PRN Avoid NSAIDs Maintain 2 large-bore IV lines Please page GI with any acute hemodynamic changes, or signs of active GI bleeding   Thank you for involving me in the care of this patient.      LOS: 0 days   Midge Minium, MD, [facility] 02/05/2020, 4:36 PM,  Pager [phone] 7am-5pm  Check AMION for 5pm -7am coverage and on weekends   Note: This dictation was prepared with Dragon  dictation along with smaller phrase technology. Any transcriptional errors that result from this process are unintentional.

## 2020-02-06 ENCOUNTER — Encounter: Payer: Self-pay | Admitting: Internal Medicine

## 2020-02-06 ENCOUNTER — Observation Stay: Payer: 59 | Admitting: Certified Registered"

## 2020-02-06 DIAGNOSIS — Z791 Long term (current) use of non-steroidal anti-inflammatories (NSAID): Secondary | ICD-10-CM | POA: Diagnosis not present

## 2020-02-06 DIAGNOSIS — Z888 Allergy status to other drugs, medicaments and biological substances status: Secondary | ICD-10-CM | POA: Diagnosis not present

## 2020-02-06 DIAGNOSIS — F5089 Other specified eating disorder: Secondary | ICD-10-CM | POA: Diagnosis present

## 2020-02-06 DIAGNOSIS — E7800 Pure hypercholesterolemia, unspecified: Secondary | ICD-10-CM | POA: Diagnosis present

## 2020-02-06 DIAGNOSIS — D500 Iron deficiency anemia secondary to blood loss (chronic): Secondary | ICD-10-CM

## 2020-02-06 DIAGNOSIS — I739 Peripheral vascular disease, unspecified: Secondary | ICD-10-CM

## 2020-02-06 DIAGNOSIS — K922 Gastrointestinal hemorrhage, unspecified: Secondary | ICD-10-CM | POA: Diagnosis present

## 2020-02-06 DIAGNOSIS — K5730 Diverticulosis of large intestine without perforation or abscess without bleeding: Secondary | ICD-10-CM | POA: Diagnosis present

## 2020-02-06 DIAGNOSIS — Z9049 Acquired absence of other specified parts of digestive tract: Secondary | ICD-10-CM | POA: Diagnosis not present

## 2020-02-06 DIAGNOSIS — F17210 Nicotine dependence, cigarettes, uncomplicated: Secondary | ICD-10-CM | POA: Diagnosis present

## 2020-02-06 DIAGNOSIS — Z91013 Allergy to seafood: Secondary | ICD-10-CM | POA: Diagnosis not present

## 2020-02-06 DIAGNOSIS — N1831 Chronic kidney disease, stage 3a: Secondary | ICD-10-CM | POA: Diagnosis present

## 2020-02-06 DIAGNOSIS — R0602 Shortness of breath: Secondary | ICD-10-CM | POA: Diagnosis present

## 2020-02-06 DIAGNOSIS — R519 Headache, unspecified: Secondary | ICD-10-CM | POA: Diagnosis present

## 2020-02-06 DIAGNOSIS — G44229 Chronic tension-type headache, not intractable: Secondary | ICD-10-CM | POA: Diagnosis not present

## 2020-02-06 DIAGNOSIS — I10 Essential (primary) hypertension: Secondary | ICD-10-CM

## 2020-02-06 DIAGNOSIS — K5520 Angiodysplasia of colon without hemorrhage: Secondary | ICD-10-CM | POA: Diagnosis not present

## 2020-02-06 DIAGNOSIS — D649 Anemia, unspecified: Secondary | ICD-10-CM | POA: Diagnosis present

## 2020-02-06 DIAGNOSIS — Z91012 Allergy to eggs: Secondary | ICD-10-CM | POA: Diagnosis not present

## 2020-02-06 DIAGNOSIS — I70211 Atherosclerosis of native arteries of extremities with intermittent claudication, right leg: Secondary | ICD-10-CM | POA: Diagnosis present

## 2020-02-06 DIAGNOSIS — Z79899 Other long term (current) drug therapy: Secondary | ICD-10-CM | POA: Diagnosis not present

## 2020-02-06 DIAGNOSIS — E785 Hyperlipidemia, unspecified: Secondary | ICD-10-CM | POA: Diagnosis not present

## 2020-02-06 DIAGNOSIS — Z7902 Long term (current) use of antithrombotics/antiplatelets: Secondary | ICD-10-CM | POA: Diagnosis not present

## 2020-02-06 DIAGNOSIS — D62 Acute posthemorrhagic anemia: Secondary | ICD-10-CM | POA: Diagnosis not present

## 2020-02-06 DIAGNOSIS — K219 Gastro-esophageal reflux disease without esophagitis: Secondary | ICD-10-CM | POA: Diagnosis present

## 2020-02-06 DIAGNOSIS — Z6823 Body mass index (BMI) 23.0-23.9, adult: Secondary | ICD-10-CM | POA: Diagnosis not present

## 2020-02-06 DIAGNOSIS — Z20822 Contact with and (suspected) exposure to covid-19: Secondary | ICD-10-CM | POA: Diagnosis present

## 2020-02-06 DIAGNOSIS — G44221 Chronic tension-type headache, intractable: Secondary | ICD-10-CM | POA: Diagnosis not present

## 2020-02-06 DIAGNOSIS — N179 Acute kidney failure, unspecified: Secondary | ICD-10-CM | POA: Diagnosis present

## 2020-02-06 DIAGNOSIS — I129 Hypertensive chronic kidney disease with stage 1 through stage 4 chronic kidney disease, or unspecified chronic kidney disease: Secondary | ICD-10-CM | POA: Diagnosis present

## 2020-02-06 DIAGNOSIS — K31819 Angiodysplasia of stomach and duodenum without bleeding: Secondary | ICD-10-CM | POA: Diagnosis not present

## 2020-02-06 LAB — CBC
HCT: 20 % — ABNORMAL LOW (ref 36.0–46.0)
HCT: 23.7 % — ABNORMAL LOW (ref 36.0–46.0)
Hemoglobin: 6.1 g/dL — ABNORMAL LOW (ref 12.0–15.0)
Hemoglobin: 7.7 g/dL — ABNORMAL LOW (ref 12.0–15.0)
MCH: 20.5 pg — ABNORMAL LOW (ref 26.0–34.0)
MCH: 22.8 pg — ABNORMAL LOW (ref 26.0–34.0)
MCHC: 30.5 g/dL (ref 30.0–36.0)
MCHC: 32.5 g/dL (ref 30.0–36.0)
MCV: 67.3 fL — ABNORMAL LOW (ref 80.0–100.0)
MCV: 70.3 fL — ABNORMAL LOW (ref 80.0–100.0)
Platelets: 415 10*3/uL — ABNORMAL HIGH (ref 150–400)
Platelets: 430 10*3/uL — ABNORMAL HIGH (ref 150–400)
RBC: 2.97 MIL/uL — ABNORMAL LOW (ref 3.87–5.11)
RBC: 3.37 MIL/uL — ABNORMAL LOW (ref 3.87–5.11)
RDW: 28.9 % — ABNORMAL HIGH (ref 11.5–15.5)
WBC: 7.3 10*3/uL (ref 4.0–10.5)
WBC: 8 10*3/uL (ref 4.0–10.5)
nRBC: 0.8 % — ABNORMAL HIGH (ref 0.0–0.2)
nRBC: 1 % — ABNORMAL HIGH (ref 0.0–0.2)

## 2020-02-06 LAB — SARS CORONAVIRUS 2 (TAT 6-24 HRS): SARS Coronavirus 2: NEGATIVE

## 2020-02-06 LAB — BASIC METABOLIC PANEL
Anion gap: 9 (ref 5–15)
BUN: 14 mg/dL (ref 8–23)
CO2: 26 mmol/L (ref 22–32)
Calcium: 9.2 mg/dL (ref 8.9–10.3)
Chloride: 100 mmol/L (ref 98–111)
Creatinine, Ser: 1.04 mg/dL — ABNORMAL HIGH (ref 0.44–1.00)
GFR, Estimated: 59 mL/min — ABNORMAL LOW (ref >60)
Glucose, Bld: 91 mg/dL (ref 70–99)
Potassium: 3.8 mmol/L (ref 3.5–5.1)
Sodium: 135 mmol/L (ref 135–145)

## 2020-02-06 LAB — PREPARE RBC (CROSSMATCH)

## 2020-02-06 LAB — HIV ANTIBODY (ROUTINE TESTING W REFLEX): HIV Screen 4th Generation wRfx: NONREACTIVE

## 2020-02-06 MED ORDER — FUROSEMIDE 10 MG/ML IJ SOLN: 20.0000 mg | Freq: Once | INTRAMUSCULAR | Status: DC

## 2020-02-06 MED ORDER — SODIUM CHLORIDE 0.9 % IV SOLN: 400.0000 mg | Freq: Once | INTRAVENOUS | Status: DC

## 2020-02-06 MED ORDER — OXYCODONE HCL 5 MG PO TABS
5.0000 mg | ORAL_TABLET | ORAL | Status: DC | PRN
  Administered 2020-02-06 – 2020-02-08 (×8): 5 mg via ORAL
  Filled 2020-02-06 (×9): qty 1

## 2020-02-06 MED ORDER — SODIUM CHLORIDE 0.9 % IV SOLN
400.0000 mg | Freq: Once | INTRAVENOUS | Status: AC
  Administered 2020-02-06: 400 mg via INTRAVENOUS
  Filled 2020-02-06: qty 20

## 2020-02-06 MED ORDER — SODIUM CHLORIDE 0.9% IV SOLUTION: Freq: Once | INTRAVENOUS | Status: DC

## 2020-02-06 MED ORDER — ACETAMINOPHEN 325 MG PO TABS: 650.0000 mg | ORAL_TABLET | Freq: Once | ORAL | Status: DC

## 2020-02-06 MED ORDER — SODIUM CHLORIDE 0.9% IV SOLUTION: Freq: Once | INTRAVENOUS | Status: AC

## 2020-02-06 NOTE — Progress Notes (Signed)
Patient ID: Gina Nguyen, female   DOB: 07/09/1955, 65 y.o.   MRN: [id number] [facility] PROGRESS NOTE  CLORISSA GRUENBERG NGE:[id number] DOB: March 29, 1955 DOA: 02/05/2020 PCP: Lauro Regulus, MD  HPI/Subjective: The patient went to her doctor complaining of weakness for the last few weeks.  Her doctor called her to come to the hospital with a low hemoglobin.  Hemoglobin was 3.7.  Patient received transfusions and hemoglobin this morning up to 7.7.  Patient feeling better.  Objective: Vitals:   02/06/20 0749 02/06/20 1142  BP: (!) 133/59 (!) 142/67  Pulse: 78 76  Resp: 17 18  Temp: 98.2 F (36.8 C) 98.1 F (36.7 C)  SpO2: 99% 99%    Intake/Output Summary (Last 24 hours) at 02/06/2020 1439 Last data filed at 02/06/2020 0536 Gross per 24 hour  Intake 1428 ml  Output --  Net 1428 ml   Filed Weights   02/05/20 1149 02/06/20 0345  Weight: 64.4 kg 64.4 kg    ROS: Review of Systems  Constitutional: Positive for malaise/fatigue.  Respiratory: Negative for shortness of breath.   Cardiovascular: Negative for chest pain.  Gastrointestinal: Negative for abdominal pain, nausea and vomiting.   Exam: Physical Exam HENT:     Head: Normocephalic.     Mouth/Throat:     Pharynx: No oropharyngeal exudate.  Eyes:     General: Lids are normal.     Conjunctiva/sclera: Conjunctivae normal.  Cardiovascular:     Rate and Rhythm: Normal rate and regular rhythm.     Heart sounds: Normal heart sounds, S1 normal and S2 normal.  Pulmonary:     Breath sounds: Normal breath sounds. No decreased breath sounds, wheezing, rhonchi or rales.  Abdominal:     Palpations: Abdomen is soft.     Tenderness: There is no abdominal tenderness.  Musculoskeletal:     Right lower leg: No swelling.     Left lower leg: No swelling.  Skin:    General: Skin is warm.     Findings: No rash.  Neurological:     Mental Status: She is alert and oriented to person, place, and time.       Data Reviewed: Basic  Metabolic Panel: Recent Labs  Lab 02/05/20 1204 02/06/20 0642  NA 132* 135  K 3.9 3.8  CL 96* 100  CO2 25 26  GLUCOSE 102* 91  BUN 17 14  CREATININE 1.25* 1.04*  CALCIUM 9.5 9.2   CBC: Recent Labs  Lab 02/05/20 1204 02/06/20 0028 02/06/20 0642  WBC 7.4 8.0 7.3  HGB 3.7* 6.1* 7.7*  HCT 13.0* 20.0* 23.7*  MCV 56.5* 67.3* 70.3*  PLT 560* 430* 415*     Recent Results (from the past 240 hour(s))  SARS CORONAVIRUS 2 (TAT 6-24 HRS) Nasopharyngeal Nasopharyngeal Swab     Status: None   Collection Time: 02/05/20  1:36 PM   Specimen: Nasopharyngeal Swab  Result Value Ref Range Status   SARS Coronavirus 2 NEGATIVE NEGATIVE Final    Comment: (NOTE) SARS-CoV-2 target nucleic acids are NOT DETECTED.  The SARS-CoV-2 RNA is generally detectable in upper and lower respiratory specimens during the acute phase of infection. Negative results do not preclude SARS-CoV-2 infection, do not rule out co-infections with other pathogens, and should not be used as the sole basis for treatment or other patient management decisions. Negative results must be combined with clinical observations, patient history, and epidemiological information. The expected result is Negative.  Fact Sheet for Patients: HairSlick.no  Fact Sheet for  Healthcare Providers: quierodirigir.com  This test is not yet approved or cleared by the [place] and  has been authorized for detection and/or diagnosis of SARS-CoV-2 by FDA under an Emergency Use Authorization (EUA). This EUA will remain  in effect (meaning this test can be used) for the duration of the COVID-19 declaration under Se ction 564(b)(1) of the Act, 21 U.S.C. section 360bbb-3(b)(1), unless the authorization is terminated or revoked sooner.  Performed at [facility], [address] [place], [place] [address]      Studies: DG Chest Port 1 View  Result Date: 02/05/2020 CLINICAL  DATA:  Shortness of breath. EXAM: PORTABLE CHEST 1 VIEW COMPARISON:  None. FINDINGS: The heart size and mediastinal contours are within normal limits. Both lungs are clear. The visualized skeletal structures are unremarkable. IMPRESSION: No active disease. Electronically Signed   By: Obie Dredge M.D.   On: 02/05/2020 14:24    Scheduled Meds: . sodium chloride   Intravenous Once  . acetaminophen  650 mg Oral Once  . amLODipine  5 mg Oral Daily  . pravastatin  40 mg Oral QHS  . propranolol  40 mg Oral BID   Continuous Infusions: . pantoprazole (PROTONIX) IV 80 mg (02/06/20 0814)    Assessment/Plan:  1. Chronic blood loss anemia with a hemoglobin of 3.7 on presentation.  Patient on Protonix.  Received 3 units of packed red blood cells.  I also gave IV iron this morning.  Hold Plavix.  GI to hold off on procedures for a few days until Plavix is out of the system. 2. Peripheral vascular disease.  Holding Plavix 3. Hyperlipidemia on pravastatin 4. Essential hypertension on propanolol, norvasc 5. Pica.  Advised to stop eating ice. 6. Chronic kidney disease stage IIIa on labs today.  Likely creatinine elevated with GI bleed.  Creatinine should improve tomorrow.        Code Status:     Code Status Orders  (From admission, onward)         Start     Ordered   02/05/20 1415  Full code  Continuous        02/05/20 1416        Code Status History    Date Active Date Inactive Code Status Order ID Comments User Context   01/15/2020 1124 01/15/2020 1754 Full Code [id number]  Schnier, Latina Craver, MD Inpatient   Advance Care Planning Activity     Family Communication: Spoke with husband on the phone Disposition Plan: Status is: Inpatient  Dispo: The patient is from: Home              Anticipated d/c is to: Home              Anticipated d/c date is: Likely will be in the hospital 3 days since procedure delayed until Plavix fully out of the system              Patient currently  required multiple transfusions and iron infusion for severe iron deficiency anemia and chronic blood loss   Difficult to place patient.  No.  Consultants:  Gastroenterology  Time spent: 28 minutes  Giana Castner [employer]

## 2020-02-06 NOTE — Plan of Care (Signed)

## 2020-02-06 NOTE — Progress Notes (Signed)
Patient to ENDO for scheduled Upper Endoscopy/Colonoscopy with Dr. Tobi Bastos for symptomatic anemia.  Upon preoperative interview, it was noted that the patient has only been off of her Plavix x 1 day.  Due to the Plavix not being stopped, both of the procedures have been postponed until Saturday, February 09, 2020.  Dr. Tobi Bastos discussed the plan with the patient and she verbalized understanding and agreement of the plan.  Dr. Tobi Bastos is to enter order for the patient to start a Low Fiber/Low Residue diet today.

## 2020-02-07 ENCOUNTER — Ambulatory Visit (INDEPENDENT_AMBULATORY_CARE_PROVIDER_SITE_OTHER): Payer: 59 | Admitting: Vascular Surgery

## 2020-02-07 DIAGNOSIS — I739 Peripheral vascular disease, unspecified: Secondary | ICD-10-CM | POA: Diagnosis not present

## 2020-02-07 DIAGNOSIS — G44221 Chronic tension-type headache, intractable: Secondary | ICD-10-CM | POA: Diagnosis not present

## 2020-02-07 DIAGNOSIS — D500 Iron deficiency anemia secondary to blood loss (chronic): Secondary | ICD-10-CM | POA: Diagnosis not present

## 2020-02-07 DIAGNOSIS — F5089 Other specified eating disorder: Secondary | ICD-10-CM | POA: Diagnosis not present

## 2020-02-07 DIAGNOSIS — N182 Chronic kidney disease, stage 2 (mild): Secondary | ICD-10-CM

## 2020-02-07 LAB — CBC
HCT: 24 % — ABNORMAL LOW (ref 36.0–46.0)
Hemoglobin: 7.7 g/dL — ABNORMAL LOW (ref 12.0–15.0)
MCH: 22.9 pg — ABNORMAL LOW (ref 26.0–34.0)
MCHC: 32.1 g/dL (ref 30.0–36.0)
MCV: 71.4 fL — ABNORMAL LOW (ref 80.0–100.0)
Platelets: 381 10*3/uL (ref 150–400)
RBC: 3.36 MIL/uL — ABNORMAL LOW (ref 3.87–5.11)
WBC: 9.7 10*3/uL (ref 4.0–10.5)
nRBC: 1.2 % — ABNORMAL HIGH (ref 0.0–0.2)

## 2020-02-07 LAB — BASIC METABOLIC PANEL
Anion gap: 10 (ref 5–15)
BUN: 7 mg/dL — ABNORMAL LOW (ref 8–23)
CO2: 25 mmol/L (ref 22–32)
Calcium: 9.5 mg/dL (ref 8.9–10.3)
Chloride: 98 mmol/L (ref 98–111)
Creatinine, Ser: 1 mg/dL (ref 0.44–1.00)
GFR, Estimated: >60 mL/min (ref >60)
Glucose, Bld: 91 mg/dL (ref 70–99)
Potassium: 3.8 mmol/L (ref 3.5–5.1)
Sodium: 133 mmol/L — ABNORMAL LOW (ref 135–145)

## 2020-02-07 MED ORDER — SODIUM CHLORIDE 0.9 % IV SOLN
400.0000 mg | Freq: Once | INTRAVENOUS | Status: AC
Start: 2020-02-08 — End: 2020-02-08
  Administered 2020-02-08: 400 mg via INTRAVENOUS
  Filled 2020-02-07: qty 20

## 2020-02-07 MED ORDER — BUTALBITAL-APAP-CAFFEINE 50-325-40 MG PO TABS
1.0000 | ORAL_TABLET | ORAL | Status: DC | PRN
  Administered 2020-02-07 – 2020-02-08 (×2): 1 via ORAL
  Filled 2020-02-07 (×2): qty 1

## 2020-02-07 MED ORDER — SODIUM CHLORIDE 0.9 % IV SOLN
INTRAVENOUS | Status: DC | PRN
  Administered 2020-02-07: 500 mL via INTRAVENOUS

## 2020-02-07 MED ORDER — TRAZODONE HCL 50 MG PO TABS
50.0000 mg | ORAL_TABLET | Freq: Every day | ORAL | Status: DC
  Administered 2020-02-07 – 2020-02-08 (×2): 50 mg via ORAL
  Filled 2020-02-07 (×2): qty 1

## 2020-02-07 MED ORDER — DEXAMETHASONE SODIUM PHOSPHATE 10 MG/ML IJ SOLN
10.0000 mg | Freq: Once | INTRAMUSCULAR | Status: AC
  Administered 2020-02-07: 10 mg via INTRAVENOUS
  Filled 2020-02-07: qty 1

## 2020-02-07 MED ORDER — MAGNESIUM SULFATE 2 GM/50ML IV SOLN
2.0000 g | Freq: Once | INTRAVENOUS | Status: AC
  Administered 2020-02-07: 2 g via INTRAVENOUS
  Filled 2020-02-07: qty 50

## 2020-02-07 NOTE — Progress Notes (Signed)
Patient ID: ROSMARIE ESQUIBEL, female   DOB: 1955/10/26, 65 y.o.   MRN: [id number] [facility] PROGRESS NOTE  LATIKA KRONICK ACZ:[id number] DOB: 1955/05/30 DOA: 02/05/2020 PCP: Lauro Regulus, MD  HPI/Subjective: Patient still having some headache this morning.  I ordered some magnesium and Decadron.  She states that she normally takes Goody powder for her headaches.  Her headaches could be a rebound from stopping the [place] powder since being in the hospital.  Patient deferred any imaging of her head.  Came in initially with a hemoglobin of 3.7.  Objective: Vitals:   02/07/20 0751 02/07/20 1129  BP: (!) 129/55 (!) 150/69  Pulse: 86 87  Resp: 17 17  Temp: 98.2 F (36.8 C) 98.2 F (36.8 C)  SpO2: 98% 95%    Intake/Output Summary (Last 24 hours) at 02/07/2020 1226 Last data filed at 02/06/2020 1700 Gross per 24 hour  Intake 200 ml  Output --  Net 200 ml   Filed Weights   02/05/20 1149 02/06/20 0345  Weight: 64.4 kg 64.4 kg    ROS: Review of Systems  Respiratory: Negative for shortness of breath.   Cardiovascular: Negative for chest pain.  Gastrointestinal: Negative for abdominal pain, nausea and vomiting.  Neurological: Positive for headaches.   Exam: Physical Exam HENT:     Head: Normocephalic.     Mouth/Throat:     Pharynx: No oropharyngeal exudate.  Eyes:     General: Lids are normal.     Conjunctiva/sclera: Conjunctivae normal.  Cardiovascular:     Rate and Rhythm: Normal rate and regular rhythm.     Heart sounds: Normal heart sounds, S1 normal and S2 normal.  Pulmonary:     Breath sounds: No decreased breath sounds, wheezing, rhonchi or rales.  Abdominal:     Palpations: Abdomen is soft.     Tenderness: There is no abdominal tenderness.  Musculoskeletal:     Right lower leg: No swelling.     Left lower leg: No swelling.  Skin:    General: Skin is warm.     Findings: No rash.  Neurological:     Mental Status: She is alert and oriented to person, place,  and time.       Data Reviewed: Basic Metabolic Panel: Recent Labs  Lab 02/05/20 1204 02/06/20 0642 02/07/20 0623  NA 132* 135 133*  K 3.9 3.8 3.8  CL 96* 100 98  CO2 25 26 25   GLUCOSE 102* 91 91  BUN 17 14 7*  CREATININE 1.25* 1.04* 1.00  CALCIUM 9.5 9.2 9.5   CBC: Recent Labs  Lab 02/05/20 1204 02/06/20 0028 02/06/20 0642 02/07/20 0623  WBC 7.4 8.0 7.3 9.7  HGB 3.7* 6.1* 7.7* 7.7*  HCT 13.0* 20.0* 23.7* 24.0*  MCV 56.5* 67.3* 70.3* 71.4*  PLT 560* 430* 415* 381     Recent Results (from the past 240 hour(s))  SARS CORONAVIRUS 2 (TAT 6-24 HRS) Nasopharyngeal Nasopharyngeal Swab     Status: None   Collection Time: 02/05/20  1:36 PM   Specimen: Nasopharyngeal Swab  Result Value Ref Range Status   SARS Coronavirus 2 NEGATIVE NEGATIVE Final    Comment: (NOTE) SARS-CoV-2 target nucleic acids are NOT DETECTED.  The SARS-CoV-2 RNA is generally detectable in upper and lower respiratory specimens during the acute phase of infection. Negative results do not preclude SARS-CoV-2 infection, do not rule out co-infections with other pathogens, and should not be used as the sole basis for treatment or other patient management decisions. Negative  results must be combined with clinical observations, patient history, and epidemiological information. The expected result is Negative.  Fact Sheet for Patients: HairSlick.no  Fact Sheet for Healthcare Providers: quierodirigir.com  This test is not yet approved or cleared by the [place] FDA and  has been authorized for detection and/or diagnosis of SARS-CoV-2 by FDA under an Emergency Use Authorization (EUA). This EUA will remain  in effect (meaning this test can be used) for the duration of the COVID-19 declaration under Se ction 564(b)(1) of the Act, 21 U.S.C. section 360bbb-3(b)(1), unless the authorization is terminated or revoked sooner.  Performed at [facility], [address] [place], [place] [address]      Studies: DG Chest Port 1 View  Result Date: 02/05/2020 CLINICAL DATA:  Shortness of breath. EXAM: PORTABLE CHEST 1 VIEW COMPARISON:  None. FINDINGS: The heart size and mediastinal contours are within normal limits. Both lungs are clear. The visualized skeletal structures are unremarkable. IMPRESSION: No active disease. Electronically Signed   By: Obie Dredge M.D.   On: 02/05/2020 14:24    Scheduled Meds: . sodium chloride   Intravenous Once  . amLODipine  5 mg Oral Daily  . pravastatin  40 mg Oral QHS  . propranolol  40 mg Oral BID  . traZODone  50 mg Oral QHS   Continuous Infusions: . sodium chloride 500 mL (02/07/20 0946)  . [START ON 02/08/2020] iron sucrose    . pantoprazole (PROTONIX) IV 80 mg (02/07/20 1224)    Assessment/Plan:  1. Chronic blood loss anemia.  Initial hemoglobin 3.7 on presentation.  Patient on Protonix.  Patient received 3 units of packed red blood cells and IV iron.  Continue to hold Plavix and Goody powder.  Procedures moved to Saturday. 2. Headache, likely cluster type with stopping Goody powder.  Will give Decadron and IV magnesium.  Patient refused imaging.  Could be a rebound headache from stopping Goody powder. 3. Peripheral vascular disease.  Holding Plavix 4. Hyperlipidemia unspecified.  Continue pravastatin 5. Essential hypertension.  On propranolol Norvasc 6. Pica.  Advised not to eat ice. 7. Chronic kidney disease stage II.  I spoke with patient relations and went back to speak with patient and then spoke with the husband on the phone.  They were okay with staying here for further care.    Code Status:     Code Status Orders  (From admission, onward)         Start     Ordered   02/05/20 1415  Full code  Continuous        02/05/20 1416        Code Status History    Date Active Date Inactive Code Status Order ID Comments User Context   01/15/2020 1124 01/15/2020 1754 Full  Code [id number]  Schnier, Latina Craver, MD Inpatient   Advance Care Planning Activity     Family Communication: Spoke with patient's husband on the phone Disposition Plan: Status is: Inpatient  Dispo: The patient is from: Home              Anticipated d/c is to: Home              Anticipated d/c date is: 2 or 3 days depending on colonoscopy and endoscopy results on Saturday              Patient currently awaiting procedures on Saturday.  Holding Plavix.   Difficult to place patient.  No.  Patient will go  home  Time spent: 29 minutes  Alizandra Loh [employer]

## 2020-02-08 DIAGNOSIS — G44229 Chronic tension-type headache, not intractable: Secondary | ICD-10-CM

## 2020-02-08 DIAGNOSIS — I739 Peripheral vascular disease, unspecified: Secondary | ICD-10-CM | POA: Diagnosis not present

## 2020-02-08 DIAGNOSIS — D649 Anemia, unspecified: Secondary | ICD-10-CM | POA: Diagnosis not present

## 2020-02-08 DIAGNOSIS — E785 Hyperlipidemia, unspecified: Secondary | ICD-10-CM | POA: Diagnosis not present

## 2020-02-08 DIAGNOSIS — D500 Iron deficiency anemia secondary to blood loss (chronic): Secondary | ICD-10-CM | POA: Diagnosis not present

## 2020-02-08 LAB — CBC
HCT: 24.7 % — ABNORMAL LOW (ref 36.0–46.0)
Hemoglobin: 7.7 g/dL — ABNORMAL LOW (ref 12.0–15.0)
MCH: 22.5 pg — ABNORMAL LOW (ref 26.0–34.0)
MCHC: 31.2 g/dL (ref 30.0–36.0)
MCV: 72.2 fL — ABNORMAL LOW (ref 80.0–100.0)
Platelets: 365 10*3/uL (ref 150–400)
RBC: 3.42 MIL/uL — ABNORMAL LOW (ref 3.87–5.11)
WBC: 9.8 10*3/uL (ref 4.0–10.5)
nRBC: 1.5 % — ABNORMAL HIGH (ref 0.0–0.2)

## 2020-02-08 LAB — BPAM RBC
Blood Product Expiration Date: 202203012359
Blood Product Expiration Date: 202203022359
Blood Product Expiration Date: 202203032359
Blood Product Expiration Date: 202203032359
ISSUE DATE / TIME: 202202011357
ISSUE DATE / TIME: 202202011830
ISSUE DATE / TIME: 202202020208
Unit Type and Rh: 7300
Unit Type and Rh: 7300
Unit Type and Rh: 7300
Unit Type and Rh: 7300

## 2020-02-08 LAB — TYPE AND SCREEN
ABO/RH(D): B POS
Antibody Screen: NEGATIVE
Unit division: 0
Unit division: 0
Unit division: 0
Unit division: 0

## 2020-02-08 LAB — PREPARE RBC (CROSSMATCH)

## 2020-02-08 MED ORDER — SODIUM CHLORIDE 0.9 % IV SOLN
400.0000 mg | Freq: Once | INTRAVENOUS | Status: AC
  Administered 2020-02-08: 400 mg via INTRAVENOUS
  Filled 2020-02-08: qty 20

## 2020-02-08 MED ORDER — NICOTINE 7 MG/24HR TD PT24
7.0000 mg | MEDICATED_PATCH | Freq: Every day | TRANSDERMAL | Status: DC
  Administered 2020-02-08 – 2020-02-09 (×2): 7 mg via TRANSDERMAL
  Filled 2020-02-08 (×2): qty 1

## 2020-02-08 MED ORDER — PEG 3350-KCL-NA BICARB-NACL 420 G PO SOLR
4000.0000 mL | Freq: Once | ORAL | Status: AC
  Administered 2020-02-08: 4000 mL via ORAL
  Filled 2020-02-08: qty 4000

## 2020-02-08 NOTE — Progress Notes (Signed)
Midge Minium, MD [facility]   [address], [address] [place], [place] [address] Phone: [phone] Fax : [phone]   Subjective: The patient has no complaints today and is doing well.  The patient has no issues overnight.  She denies any black stools or bloody stools.  The patient had been on Plavix and was taking Goody powders.  She is now waiting for the Plavix to wear off and has been told to not take anymore Goody powders   Objective: Vital signs in last 24 hours: Vitals:   02/07/20 2014 02/08/20 0015 02/08/20 0429 02/08/20 0732  BP: (!) 158/70 130/60 (!) 130/59 (!) 152/86  Pulse: 95 78 78 81  Resp: 16 18 17 17   Temp: 98.7 F (37.1 C) 98.9 F (37.2 C) 98.6 F (37 C) 98.2 F (36.8 C)  TempSrc:  Oral Oral   SpO2: 96% 98% 96% 97%  Weight:      Height:       Weight change:   Intake/Output Summary (Last 24 hours) at 02/08/2020 1028 Last data filed at 02/08/2020 1000 Gross per 24 hour  Intake 600.9 ml  Output --  Net 600.9 ml     Exam: Heart:: Regular rate and rhythm, S1S2 present or without murmur or extra heart sounds Lungs: normal and clear to auscultation and percussion Abdomen: soft, nontender, normal bowel sounds   Lab Results: @LABTEST2 @ Micro Results: Recent Results (from the past 240 hour(s))  SARS CORONAVIRUS 2 (TAT 6-24 HRS) Nasopharyngeal Nasopharyngeal Swab     Status: None   Collection Time: 02/05/20  1:36 PM   Specimen: Nasopharyngeal Swab  Result Value Ref Range Status   SARS Coronavirus 2 NEGATIVE NEGATIVE Final    Comment: (NOTE) SARS-CoV-2 target nucleic acids are NOT DETECTED.  The SARS-CoV-2 RNA is generally detectable in upper and lower respiratory specimens during the acute phase of infection. Negative results do not preclude SARS-CoV-2 infection, do not rule out co-infections with other pathogens, and should not be used as the sole basis for treatment or other patient management decisions. Negative results must be combined with clinical  observations, patient history, and epidemiological information. The expected result is Negative.  Fact Sheet for Patients:  Fact Sheet for Healthcare Providers: 04/04/20  This test is not yet approved or cleared by the [employer] FDA and  has been authorized for detection and/or diagnosis of SARS-CoV-2 by FDA under an Emergency Use Authorization (EUA). This EUA will remain  in effect (meaning this test can be used) for the duration of the COVID-19 declaration under Se ction 564(b)(1) of the Act, 21 U.S.C. section 360bbb-3(b)(1), unless the authorization is terminated or revoked sooner.  Performed at [facility], [address], [place], [address] [place]    Studies/Results: No results found. Medications: I have reviewed the patient's current medications. Scheduled Meds: . sodium chloride   Intravenous Once  . amLODipine  5 mg Oral Daily  . nicotine  7 mg Transdermal Daily  . polyethylene glycol-electrolytes  4,000 mL Oral Once  . pravastatin  40 mg Oral QHS  . propranolol  40 mg Oral BID  . traZODone  50 mg Oral QHS   Continuous Infusions: . sodium chloride Stopped (02/07/20 1805)  . iron sucrose    . pantoprazole (PROTONIX) IV Stopped (02/07/20 2232)   PRN Meds:.sodium chloride, acetaminophen **OR** acetaminophen, butalbital-acetaminophen-caffeine, ondansetron **OR** ondansetron (ZOFRAN) IV, oxyCODONE   Assessment: Active Problems:   Atherosclerosis of native arteries of extremity with intermittent claudication (HCC)  Essential hypertension   Hyperlipidemia   PVD (peripheral vascular disease) (HCC)   Chronic headaches   Hypercholesterolemia   Severe anemia   AKI (acute kidney injury) (HCC)   Tobacco use disorder, continuous   Anemia   Pica   Stage 2 chronic kidney disease    Plan: The patient will be set up for an EGD and colonoscopy to be done tomorrow since it will be  enough time after stopping her Plavix so that she can undergo both those procedures.  The patient has been reminded that she needs to stay off of NSAIDs especially since she is on Plavix and came in with a hemoglobin of 3.7.  The patient has been explained the plan and agrees with it.  The orders have been written for the EGD and colonoscopy including a prep to be done today.   LOS: 2 days   Sherlyn Hay 02/08/2020, 10:28 AM Pager [phone] 7am-5pm  Check AMION for 5pm -7am coverage and on weekends

## 2020-02-08 NOTE — Progress Notes (Signed)
Patient ID: Gina Nguyen, female   DOB: 27-Aug-1955, 65 y.o.   MRN: [id number] [facility] PROGRESS NOTE  Gina Nguyen NIO:[id number] DOB: [birth year] DOA: 02/05/2020 PCP: Lauro Regulus, MD  HPI/Subjective: Patient feeling better today.  Headache down to 1 out of 10 in intensity.  No abdominal pain.  No nausea or vomiting.  States that she does have acid reflux at night at home.  Sent in for severe anemia.  Objective: Vitals:   02/08/20 0732 02/08/20 1111  BP: (!) 152/86 (!) 157/74  Pulse: 81 85  Resp: 17 17  Temp: 98.2 F (36.8 C) 98.4 F (36.9 C)  SpO2: 97% 97%    Intake/Output Summary (Last 24 hours) at 02/08/2020 1353 Last data filed at 02/08/2020 1000 Gross per 24 hour  Intake 600.9 ml  Output --  Net 600.9 ml   Filed Weights   02/05/20 1149 02/06/20 0345  Weight: 64.4 kg 64.4 kg    ROS: Review of Systems  Respiratory: Negative for shortness of breath.   Cardiovascular: Negative for chest pain.  Gastrointestinal: Negative for abdominal pain, nausea and vomiting.   Exam: Physical Exam HENT:     Head: Normocephalic.     Mouth/Throat:     Pharynx: No oropharyngeal exudate.  Eyes:     General: Lids are normal.     Conjunctiva/sclera: Conjunctivae normal.  Cardiovascular:     Rate and Rhythm: Normal rate and regular rhythm.     Heart sounds: Normal heart sounds, S1 normal and S2 normal.  Pulmonary:     Breath sounds: No decreased breath sounds, wheezing, rhonchi or rales.  Abdominal:     Palpations: Abdomen is soft.     Tenderness: There is no abdominal tenderness.  Musculoskeletal:     Right lower leg: No swelling.     Left lower leg: No swelling.  Skin:    General: Skin is warm.     Findings: No rash.  Neurological:     Mental Status: She is alert and oriented to person, place, and time.       Data Reviewed: Basic Metabolic Panel: Recent Labs  Lab 02/05/20 1204 02/06/20 0642 02/07/20 0623  NA 132* 135 133*  K 3.9 3.8 3.8  CL 96* 100  98  CO2 25 26 25   GLUCOSE 102* 91 91  BUN 17 14 7*  CREATININE 1.25* 1.04* 1.00  CALCIUM 9.5 9.2 9.5   CBC: Recent Labs  Lab 02/05/20 1204 02/06/20 0028 02/06/20 0642 02/07/20 0623 02/08/20 0606  WBC 7.4 8.0 7.3 9.7 9.8  HGB 3.7* 6.1* 7.7* 7.7* 7.7*  HCT 13.0* 20.0* 23.7* 24.0* 24.7*  MCV 56.5* 67.3* 70.3* 71.4* 72.2*  PLT 560* 430* 415* 381 365     Recent Results (from the past 240 hour(s))  SARS CORONAVIRUS 2 (TAT 6-24 HRS) Nasopharyngeal Nasopharyngeal Swab     Status: None   Collection Time: 02/05/20  1:36 PM   Specimen: Nasopharyngeal Swab  Result Value Ref Range Status   SARS Coronavirus 2 NEGATIVE NEGATIVE Final    Comment: (NOTE) SARS-CoV-2 target nucleic acids are NOT DETECTED.  The SARS-CoV-2 RNA is generally detectable in upper and lower respiratory specimens during the acute phase of infection. Negative results do not preclude SARS-CoV-2 infection, do not rule out co-infections with other pathogens, and should not be used as the sole basis for treatment or other patient management decisions. Negative results must be combined with clinical observations, patient history, and epidemiological information. The expected result is Negative.  Fact Sheet for Patients: [employer]  Fact Sheet for Healthcare Providers: quier[employer]ir[employer]ir.com  This test is not yet approved or cleared by the [place] FDA and  has been authorized for detection and/or diagnosis of SARS-CoV-2 by FDA under an Emergency Use Authorization (EUA). This EUA will remain  in effect (meaning this test can be used) for the duration of the COVID-19 declaration under Se ction 564(b)(1) of the Act, 21 U.S.C. section 360bbb-3(b)(1), unless the authorization is terminated or revoked sooner.  Performed at [facility], [address] [place], [place] [address]      Scheduled Meds: . sodium chloride   Intravenous Once  . amLODipine   5 mg Oral Daily  . nicotine  7 mg Transdermal Daily  . polyethylene glycol-electrolytes  4,000 mL Oral Once  . pravastatin  40 mg Oral QHS  . propranolol  40 mg Oral BID  . traZODone  50 mg Oral QHS   Continuous Infusions: . sodium chloride Stopped (02/07/20 1805)  . pantoprazole (PROTONIX) IV Stopped (02/07/20 2232)    Assessment/Plan:  1. Chronic blood loss anemia.  Initial hemoglobin 3.7 on presentation.  Patient is on Protonix.  Patient has received 3 units of packed red blood cells and 2 doses of IV iron.  Continue to hold Plavix and Goody powder.  EGD and colonoscopy tomorrow.  Depending on results will depend on when she can go home.  The patient states that she does not eat ice but she does eat a lot of popsicles. 2. Headache likely rebound in nature with stopping Goody powder.  Yesterday I gave IV Decadron and IV magnesium.  Headache intensity down to 1 out of 10 intensity. 3. Peripheral vascular disease.  Continue to hold Plavix. 4. Hyperlipidemia unspecified.  On pravastatin 5. Essential hypertension.  Continue propranolol Norvasc 6. Chronic kidney disease stage II 7. Tobacco abuse.  Patient states that she smokes 1/2 pack/day.  Will qualify her for a nicotine patch low-dose.        Code Status:     Code Status Orders  (From admission, onward)         Start     Ordered   02/05/20 1415  Full code  Continuous        02/05/20 1416        Code Status History    Date Active Date Inactive Code Status Order ID Comments User Context   01/15/2020 1124 01/15/2020 1754 Full Code [id number]  Schnier, Latina Craver, MD Inpatient   Advance Care Planning Activity     Family Communication: Left message for husband Disposition Plan: Status is: Inpatient  Dispo: The patient is from: Home              Anticipated d/c is to: Home              Anticipated d/c date is: Potentially 02/09/2020 depending on EGD and colonoscopy results              Patient currently came in with severe  iron deficiency anemia.  Holding Plavix for procedure tomorrow   Difficult to place patient.  No  Time spent: 28 minutes  Mohamadou [employer]

## 2020-02-09 ENCOUNTER — Inpatient Hospital Stay: Payer: 59 | Admitting: Anesthesiology

## 2020-02-09 ENCOUNTER — Encounter
Admission: EM | Disposition: A | Discharge status: Home / Self Care | Payer: Self-pay | Source: Home / Self Care | Attending: Internal Medicine

## 2020-02-09 ENCOUNTER — Encounter: Payer: Self-pay | Admitting: Internal Medicine

## 2020-02-09 DIAGNOSIS — K31819 Angiodysplasia of stomach and duodenum without bleeding: Secondary | ICD-10-CM | POA: Diagnosis not present

## 2020-02-09 DIAGNOSIS — D649 Anemia, unspecified: Secondary | ICD-10-CM | POA: Diagnosis not present

## 2020-02-09 DIAGNOSIS — Z7902 Long term (current) use of antithrombotics/antiplatelets: Secondary | ICD-10-CM

## 2020-02-09 DIAGNOSIS — D62 Acute posthemorrhagic anemia: Secondary | ICD-10-CM

## 2020-02-09 DIAGNOSIS — K5520 Angiodysplasia of colon without hemorrhage: Secondary | ICD-10-CM | POA: Diagnosis not present

## 2020-02-09 DIAGNOSIS — D500 Iron deficiency anemia secondary to blood loss (chronic): Secondary | ICD-10-CM | POA: Diagnosis not present

## 2020-02-09 DIAGNOSIS — Z791 Long term (current) use of non-steroidal anti-inflammatories (NSAID): Secondary | ICD-10-CM

## 2020-02-09 DIAGNOSIS — G44229 Chronic tension-type headache, not intractable: Secondary | ICD-10-CM | POA: Diagnosis not present

## 2020-02-09 HISTORY — PX: COLONOSCOPY: SHX5424

## 2020-02-09 HISTORY — PX: ESOPHAGOGASTRODUODENOSCOPY: SHX5428

## 2020-02-09 LAB — CBC
HCT: 24.6 % — ABNORMAL LOW (ref 36.0–46.0)
Hemoglobin: 7.7 g/dL — ABNORMAL LOW (ref 12.0–15.0)
MCH: 23.1 pg — ABNORMAL LOW (ref 26.0–34.0)
MCHC: 31.3 g/dL (ref 30.0–36.0)
MCV: 73.7 fL — ABNORMAL LOW (ref 80.0–100.0)
Platelets: 354 10*3/uL (ref 150–400)
RBC: 3.34 MIL/uL — ABNORMAL LOW (ref 3.87–5.11)
WBC: 9.7 10*3/uL (ref 4.0–10.5)
nRBC: 1.4 % — ABNORMAL HIGH (ref 0.0–0.2)

## 2020-02-09 SURGERY — EGD (ESOPHAGOGASTRODUODENOSCOPY)
Anesthesia: General

## 2020-02-09 SURGERY — ESOPHAGOGASTRODUODENOSCOPY (EGD) WITH PROPOFOL
Anesthesia: General

## 2020-02-09 MED ORDER — BUTALBITAL-APAP-CAFFEINE 50-325-40 MG PO TABS: 1.0000 | ORAL_TABLET | Freq: Four times a day (QID) | ORAL | 0 refills | Status: DC | PRN

## 2020-02-09 MED ORDER — LACTATED RINGERS IV SOLN: INTRAVENOUS | Status: DC | PRN

## 2020-02-09 MED ORDER — PANTOPRAZOLE SODIUM 40 MG PO TBEC: 40.0000 mg | DELAYED_RELEASE_TABLET | Freq: Two times a day (BID) | ORAL | 0 refills | Status: DC

## 2020-02-09 MED ORDER — PROPOFOL 500 MG/50ML IV EMUL
INTRAVENOUS | Status: DC | PRN
  Administered 2020-02-09: 140 ug/kg/min via INTRAVENOUS

## 2020-02-09 MED ORDER — GLYCOPYRROLATE 0.2 MG/ML IJ SOLN
INTRAMUSCULAR | Status: DC | PRN
  Administered 2020-02-09: .2 mg via INTRAVENOUS

## 2020-02-09 MED ORDER — NICOTINE 7 MG/24HR TD PT24: MEDICATED_PATCH | TRANSDERMAL | 0 refills | Status: DC

## 2020-02-09 MED ORDER — PROPOFOL 10 MG/ML IV BOLUS
INTRAVENOUS | Status: DC | PRN
  Administered 2020-02-09: 70 mg via INTRAVENOUS

## 2020-02-09 MED ORDER — LIDOCAINE HCL (CARDIAC) PF 100 MG/5ML IV SOSY
PREFILLED_SYRINGE | INTRAVENOUS | Status: DC | PRN
  Administered 2020-02-09: 100 mg via INTRAVENOUS

## 2020-02-09 NOTE — Progress Notes (Addendum)
[name], MD [address], [place], [place], [address] [address], [address], [place], [place], [address] Phone: [phone]  Fax: [phone]   Subjective: Patient's hemoglobin this morning is 7.7 which is stable compared to the last 48 to 72 hours.  She came in with a hemoglobin of 3.7.  No abdominal pain, nausea or vomiting or hematemesis.   Objective: Exam: Vital signs in last 24 hours: Vitals:   02/08/20 1510 02/08/20 2013 02/08/20 2303 02/09/20 0511  BP: 133/73 (!) 162/74 (!) 155/79 (!) 143/65  Pulse: 81 87 69 72  Resp: 17 16 16 16   Temp: 98.3 F (36.8 C) 98.2 F (36.8 C) 98.1 F (36.7 C) 98.7 F (37.1 C)  TempSrc:      SpO2: 94% 100% 100% 96%  Weight:      Height:       Weight change:   Intake/Output Summary (Last 24 hours) at 02/09/2020 0732 Last data filed at 02/08/2020 1420 Gross per 24 hour  Intake 240 ml  Output -  Net 240 ml    General: No acute distress, AAO x3 Abd: Soft, NT/ND, No HSM Skin: Warm, no rashes Neck: Supple, Trachea midline   Lab Results: Lab Results  Component Value Date   WBC 9.7 02/09/2020   HGB 7.7 (L) 02/09/2020   HCT 24.6 (L) 02/09/2020   MCV 73.7 (L) 02/09/2020   PLT 354 02/09/2020   Micro Results: Recent Results (from the past 240 hour(s))  SARS CORONAVIRUS 2 (TAT 6-24 HRS) Nasopharyngeal Nasopharyngeal Swab     Status: None   Collection Time: 02/05/20  1:36 PM   Specimen: Nasopharyngeal Swab  Result Value Ref Range Status   SARS Coronavirus 2 NEGATIVE NEGATIVE Final    Comment: (NOTE) SARS-CoV-2 target nucleic acids are NOT DETECTED.  The SARS-CoV-2 RNA is generally detectable in upper and lower respiratory specimens during the acute phase of infection. Negative results do not preclude SARS-CoV-2 infection, do not rule out co-infections with other pathogens, and should not be used as the sole basis for treatment or other patient management decisions. Negative results must be combined with  clinical observations, patient history, and epidemiological information. The expected result is Negative.  Fact Sheet for Patients: 04/04/20  Fact Sheet for Healthcare Providers: [employer]  This test is not yet approved or cleared by the [employer] FDA and  has been authorized for detection and/or diagnosis of SARS-CoV-2 by FDA under an Emergency Use Authorization (EUA). This EUA will remain  in effect (meaning this test can be used) for the duration of the COVID-19 declaration under Se ction 564(b)(1) of the Act, 21 U.S.C. section 360bbb-3(b)(1), unless the authorization is terminated or revoked sooner.  Performed at [facility], [address], [place], [place]    Studies/Results: No results found. Medications:  Scheduled Meds: . sodium chloride   Intravenous Once  . amLODipine  5 mg Oral Daily  . nicotine  7 mg Transdermal Daily  . pravastatin  40 mg Oral QHS  . propranolol  40 mg Oral BID  . traZODone  50 mg Oral QHS   Continuous Infusions: . sodium chloride Stopped (02/07/20 1805)  . pantoprazole (PROTONIX) IV 80 mg (02/08/20 2151)   PRN Meds:.sodium chloride, butalbital-acetaminophen-caffeine, ondansetron **OR** ondansetron (ZOFRAN) IV, oxyCODONE   Assessment: Active Problems:   Atherosclerosis of native arteries of extremity with intermittent claudication (HCC)   Essential hypertension   Hyperlipidemia   PVD (peripheral vascular disease) (HCC)   Chronic  headaches   Hypercholesterolemia   Severe anemia   AKI (acute kidney injury) (HCC)   Tobacco use disorder, continuous   Anemia   Pica   Stage 2 chronic kidney disease    Plan: Proceed with upper endoscopy today for evaluation of acute anemia in the setting of NSAID use and Plavix  Plavix has been held for her procedures today  PPI 40 IV twice daily  Continue serial CBCs and transfuse PRN Avoid NSAIDs Maintain 2  large-bore IV lines Please page GI with any acute hemodynamic changes, or signs of active GI bleeding   I have discussed alternative options, risks & benefits,  which include, but are not limited to, bleeding, infection, perforation,respiratory complication & drug reaction.  The patient agrees with this plan & written consent will be obtained.      LOS: 3 days   [name], MD 02/09/2020, 7:32 AM

## 2020-02-09 NOTE — Op Note (Signed)
[facility] Patient Name: Kymber Kosar Procedure Date: 02/09/2020 7:40 AM MRN: [id number] Account #: [id number] Date of Birth: 06-01-1955 Admit Type: Inpatient Age: 65 Room: [facility] ENDO ROOM 4 Gender: Female Note Status: Finalized Procedure:             Colonoscopy Indications:           Iron deficiency anemia Providers:             Rasaan Brotherton B. Bonna Gains MD, MD Referring MD:          Ocie Cornfield. Ouida Sills MD, MD (Referring MD) Medicines:             Monitored Anesthesia Care Complications:         No immediate complications. Procedure:             Pre-Anesthesia Assessment:                        - Prior to the procedure, a History and Physical was                         performed, and patient medications, allergies and                         sensitivities were reviewed. The patient's tolerance                         of previous anesthesia was reviewed.                        - The risks and benefits of the procedure and the                         sedation options and risks were discussed with the                         patient. All questions were answered and informed                         consent was obtained.                        - Patient identification and proposed procedure were                         verified prior to the procedure by the physician, the                         nurse, the anesthesiologist, the anesthetist and the                         technician. The procedure was verified in the                         pre-procedure area in the procedure room in the                         endoscopy suite.                        - Prophylactic Antibiotics: The  patient does not                         require prophylactic antibiotics.                        - ASA Grade Assessment: III - A patient with severe                         systemic disease.                        - After reviewing the risks and benefits, the patient                          was deemed in satisfactory condition to undergo the                         procedure.                        - Monitored anesthesia care was determined to be                         medically necessary for this procedure based on review                         of the patient's medical history, medications, and                         prior anesthesia history.                        - The anesthesia plan was to use monitored anesthesia                         care (MAC).                        After obtaining informed consent, the colonoscope was                         passed under direct vision. Throughout the procedure,                         the patient's blood pressure, pulse, and oxygen                         saturations were monitored continuously. The                         Colonoscope was introduced through the anus and                         advanced to the the cecum, identified by appendiceal                         orifice and ileocecal valve. The colonoscopy was                         performed with ease.  The patient tolerated the                         procedure well. The quality of the bowel preparation                         was fair. Findings:      The perianal and digital rectal examinations were normal.      A single medium-sized localized angioectasia without bleeding was found       in the ascending colon. Coagulation for hemostasis using argon plasma       was successful.      Multiple small and large-mouthed diverticula were found in the entire       colon.      The exam was otherwise without abnormality.      The retroflexed view of the distal rectum and anal verge was normal and       showed no anal or rectal abnormalities. Impression:            - Preparation of the colon was fair.                        - A single non-bleeding colonic angioectasia. Treated                         with argon plasma coagulation (APC).                         - Diverticulosis in the entire examined colon.                        - The examination was otherwise normal.                        - The distal rectum and anal verge are normal on                         retroflexion view.                        - No specimens collected. Recommendation:        - Continue Serial CBCs and transfuse PRN                        - Check iron studies and replace with IV iron if iron                         deficiency present                        - Advance diet as tolerated.                        - Return to GI clinic in 2 weeks.                        - Return to primary care physician in 4 weeks.                        - The findings and recommendations were discussed with  the patient. Procedure Code(s):     --- Professional ---                        [phone], Colonoscopy, flexible; with control of                         bleeding, any method Diagnosis Code(s):     --- Professional ---                        K55.20, Angiodysplasia of colon without hemorrhage                        D50.9, Iron deficiency anemia, unspecified CPT copyright 2019 American Medical Association. All rights reserved. The codes documented in this report are preliminary and upon coder review may  be revised to meet current compliance requirements.  Vonda Antigua, MD Margretta Sidle B. Bonna Gains MD, MD 02/09/2020 8:45:06 AM This report has been signed electronically. Number of Addenda: 0 Note Initiated On: 02/09/2020 7:40 AM Scope Withdrawal Time: 0 hours 20 minutes 37 seconds  Total Procedure Duration: 0 hours 27 minutes 22 seconds  Estimated Blood Loss:  Estimated blood loss: none.      [facility]

## 2020-02-09 NOTE — Anesthesia Preprocedure Evaluation (Signed)
Anesthesia Evaluation  Patient identified by MRN, date of birth, ID band Patient awake  General Assessment Comment:Patient presented with severe anemia , hgb below 4. S/p 3 units blood transfusion, now Hgb above 7. Patient otherwise relatively asymptomatic, no nausea or vomiting  Reviewed: Allergy & Precautions, NPO status , Patient's Chart, lab work & pertinent test results  History of Anesthesia Complications Negative for: history of anesthetic complications  Airway Mallampati: III  TM Distance: >3 FB Neck ROM: Full    Dental  (+) Edentulous Upper, Partial Lower   Pulmonary neg sleep apnea, neg COPD, Current Smoker and Patient abstained from smoking.,    Pulmonary exam normal breath sounds clear to auscultation       Cardiovascular Exercise Tolerance: Good METShypertension, + Peripheral Vascular Disease  (-) CAD and (-) Past MI (-) dysrhythmias  Rhythm:Regular Rate:Normal - Systolic murmurs    Neuro/Psych  Headaches,  Neuromuscular disease negative psych ROS   GI/Hepatic neg GERD  ,(+)     (-) substance abuse  ,   Endo/Other  neg diabetes  Renal/GU CRFRenal disease     Musculoskeletal   Abdominal   Peds  Hematology  (+) anemia ,   Anesthesia Other Findings Past Medical History: No date: Allergy No date: Arthritis No date: Hypercholesterolemia No date: Hypertension No date: Peripheral vascular disease (HCC)  Reproductive/Obstetrics                             Anesthesia Physical Anesthesia Plan  ASA: III  Anesthesia Plan: General   Post-op Pain Management:    Induction: Intravenous  PONV Risk Score and Plan: 2 and Ondansetron, Propofol infusion and TIVA  Airway Management Planned: Nasal Cannula  Additional Equipment: None  Intra-op Plan:   Post-operative Plan:   Informed Consent: I have reviewed the patients History and Physical, chart, labs and discussed the  procedure including the risks, benefits and alternatives for the proposed anesthesia with the patient or authorized representative who has indicated his/her understanding and acceptance.     Dental advisory given  Plan Discussed with: CRNA and Surgeon  Anesthesia Plan Comments: (Discussed risks of anesthesia with patient, including possibility of difficulty with spontaneous ventilation under anesthesia necessitating airway intervention, PONV, and rare risks such as cardiac or respiratory or neurological events. Patient understands.)        Anesthesia Quick Evaluation

## 2020-02-09 NOTE — Transfer of Care (Signed)
Immediate Anesthesia Transfer of Care Note  Patient: SANAYAH MUNRO  Procedure(s) Performed: ESOPHAGOGASTRODUODENOSCOPY (EGD) (N/A ) COLONOSCOPY (N/A )  Patient Location: PACU  Anesthesia Type:General  Level of Consciousness: awake, alert  and oriented  Airway & Oxygen Therapy: Patient Spontanous Breathing and Patient connected to face mask oxygen  Post-op Assessment: Report given to RN and Post -op Vital signs reviewed and stable  Post vital signs: Reviewed and stable  Last Vitals:  Vitals Value Taken Time  BP 142/74 02/09/20 0849  Temp 36.4 C 02/09/20 0849  Pulse 98 02/09/20 0849  Resp 24 02/09/20 0849  SpO2 99 % 02/09/20 0849  Vitals shown include unvalidated device data.  Last Pain:  Vitals:   02/09/20 0307  TempSrc:   PainSc: 0-No pain         Complications: No complications documented.

## 2020-02-09 NOTE — Op Note (Signed)
[facility] Gastroenterology Patient Name: Gina Nguyen Procedure Date: 02/09/2020 7:42 AM MRN: [id number] Account #: [id number] Date of Birth: 11/24/1955 Admit Type: Inpatient Age: 65 Room: [facility] ENDO ROOM 4 Gender: Female Note Status: Finalized Procedure:             Upper GI endoscopy Indications:           Iron deficiency anemia Providers:             Varnita B. Maximino Greenland MD, MD Referring MD:          Marya Amsler. Dareen Piano MD, MD (Referring MD) Medicines:             Monitored Anesthesia Care Complications:         No immediate complications. Procedure:             Pre-Anesthesia Assessment:                        - The risks and benefits of the procedure and the                         sedation options and risks were discussed with the                         patient. All questions were answered and informed                         consent was obtained.                        - Patient identification and proposed procedure were                         verified prior to the procedure.                        - ASA Grade Assessment: III - A patient with severe                         systemic disease.                        After obtaining informed consent, the endoscope was                         passed under direct vision. Throughout the procedure,                         the patient's blood pressure, pulse, and oxygen                         saturations were monitored continuously. The Endoscope                         was introduced through the mouth, and advanced to the                         second part of duodenum. The upper GI endoscopy was  accomplished with ease. The patient tolerated the                         procedure well. Findings:      A widely patent and mild Schatzki ring was found in the distal esophagus.      The exam of the esophagus was otherwise normal.      The entire examined stomach was normal.      A single 2  mm angioectasia without bleeding was found in the duodenal       bulb. Coagulation for bleeding prevention using argon plasma was       successful.      The exam of the duodenum was otherwise normal. Impression:            - Widely patent and mild Schatzki ring.                        - Normal stomach.                        - A single non-bleeding angioectasia in the duodenum.                         Treated with argon plasma coagulation (APC).                        - No specimens collected. Recommendation:        - Return patient to hospital ward for ongoing care.                        - Continue present medications.                        - The findings and recommendations were discussed with                         the patient.                        - Continue Serial CBCs and transfuse PRN                        - Check iron studies and replace with IV iron if iron                         deficiency present Procedure Code(s):     --- Professional ---                        [phone], Esophagogastroduodenoscopy, flexible,                         transoral; with control of bleeding, any method Diagnosis Code(s):     --- Professional ---                        K22.2, Esophageal obstruction                        K31.819, Angiodysplasia of stomach and duodenum  without bleeding                        D50.9, Iron deficiency anemia, unspecified CPT copyright 2019 American Medical Association. All rights reserved. The codes documented in this report are preliminary and upon coder review may  be revised to meet current compliance requirements.  [name], MD Michel Bickers B. Maximino Greenland MD, MD 02/09/2020 8:10:27 AM This report has been signed electronically. Number of Addenda: 0 Note Initiated On: 02/09/2020 7:42 AM Estimated Blood Loss:  Estimated blood loss: none.      [facility]

## 2020-02-09 NOTE — Discharge Summary (Signed)
[facility] [place] [facility]   PATIENT NAME: Gina Nguyen    MR#:  [id number]  DATE OF BIRTH:  08-11-1955  DATE OF ADMISSION:  02/05/2020 ADMITTING PHYSICIAN: Alford Highland, MD  DATE OF DISCHARGE: 02/09/2020  3:30 PM  PRIMARY CARE PHYSICIAN: Lauro Regulus, MD    ADMISSION DIAGNOSIS:  Shortness of breath [R06.02] Tobacco use [Z72.0] Severe anemia [D64.9] Symptomatic anemia [D64.9] Anemia [D64.9]  DISCHARGE DIAGNOSIS:  Active Problems:   Atherosclerosis of native arteries of extremity with intermittent claudication (HCC)   Essential hypertension   Hyperlipidemia   PVD (peripheral vascular disease) (HCC)   Chronic headaches   Hypercholesterolemia   Severe anemia   AKI (acute kidney injury) (HCC)   Tobacco use disorder, continuous   Anemia   Pica   Stage 2 chronic kidney disease   Angiodysplasia of duodenum   Symptomatic anemia   Angiodysplasia of colon   SECONDARY DIAGNOSIS:   Past Medical History:  Diagnosis Date  . Allergy   . Arthritis   . Hypercholesterolemia   . Hypertension   . Peripheral vascular disease (HCC)     HOSPITAL COURSE:   1.  Chronic blood loss anemia.  Initial hemoglobin of 3.7 on presentation.  Patient was on Protonix IV here.  Patient received 3 units of packed red blood cells and 2 infusions of iron while here.  We held Plavix and Goody powder.  EGD and colonoscopy showed an angiectasia on upper endoscopy and angiectasia on colonoscopy both were cauterized by argon plasma coagulation.  Follow-up with GI as outpatient.  As per GI can go back on Plavix the patient will hold this for about a week.  Will refer to hematology as outpatient because she if goes back on Plavix blood counts will have to be watched very closely.  Hemoglobin 7.7 and stable upon discharge. 2.  Headache.  Rebound in nature with stopping Goody powder.  As needed Fioricet.  I did give Decadron and IV magnesium during the hospital course 3.   Peripheral vascular disease hold Plavix for at least another week 4.  Hyperlipidemia unspecified on pravastatin 5.  Essential hypertension on propranolol Norvasc and lisinopril 6.  Chronic kidney disease stage II 7.  Tobacco abuse -on nicotine patch  DISCHARGE CONDITIONS:   Satisfactory  CONSULTS OBTAINED:  Treatment Team:  Midge Minium, MD  DRUG ALLERGIES:   Allergies  Allergen Reactions  . Influenza Vaccines     Other reaction(s): Other (See Comments) Allergy to eggs  . Crab Extract Allergy Skin Test     Vomiting   . Eggs Or Egg-Derived Products Swelling    Vomiting, swelling of stomach per patient   . Shrimp (Diagnostic)     vomiting    DISCHARGE MEDICATIONS:   Allergies as of 02/09/2020      Reactions   Influenza Vaccines    Other reaction(s): Other (See Comments) Allergy to eggs   Crab Extract Allergy Skin Test    Vomiting   Eggs Or Egg-derived Products Swelling   Vomiting, swelling of stomach per patient   Shrimp (diagnostic)    vomiting      Medication List    STOP taking these medications   clopidogrel 75 MG tablet Commonly known as: PLAVIX   famotidine 40 MG tablet Commonly known as: PEPCID   GOODY HEADACHE PO   omeprazole 40 MG capsule Commonly known as: PRILOSEC   vitamin C 1000 MG tablet   Vitamin D3 25 MCG (1000 UT) Caps  vitamin E 180 MG (400 UNITS) capsule     TAKE these medications   amLODipine 5 MG tablet Commonly known as: NORVASC Take 5 mg by mouth daily.   butalbital-acetaminophen-caffeine 50-325-40 MG tablet Commonly known as: FIORICET Take 1 tablet by mouth every 6 (six) hours as needed for headache or migraine.   Cyanocobalamin 1000 MCG Tbcr Take 1,000 mcg by mouth daily.   lisinopril 20 MG tablet Commonly known as: ZESTRIL Take 20 mg by mouth daily.   neomycin-polymyxin b-dexamethasone 3.5-10000-0.1 Susp Commonly known as: MAXITROL Place 1 drop into both eyes in the morning and at bedtime.   nicotine 7  mg/24hr patch Commonly known as: NICODERM CQ - dosed in mg/24 hr 1 patch chest wall daily (okay use substitute)   pantoprazole 40 MG tablet Commonly known as: PROTONIX Take 1 tablet (40 mg total) by mouth 2 (two) times daily. What changed: when to take this   pravastatin 40 MG tablet Commonly known as: PRAVACHOL Take 40 mg by mouth at bedtime.   propranolol 40 MG tablet Commonly known as: INDERAL Take 1 tablet by mouth 2 (two) times daily.   pyridoxine 100 MG tablet Commonly known as: B-6 Take 100 mg by mouth daily.   torsemide 10 MG tablet Commonly known as: DEMADEX Take 10 mg by mouth daily.   traZODone 50 MG tablet Commonly known as: DESYREL Take 50-100 mg by mouth at bedtime as needed for sleep.        DISCHARGE INSTRUCTIONS:   Follow-up PMD 1 week Follow-up GI 2 weeks Follow-up hematology 3 weeks  If you experience worsening of your admission symptoms, develop shortness of breath, life threatening emergency, suicidal or homicidal thoughts you must seek medical attention immediately by calling 911 or calling your MD immediately  if symptoms less severe.  You Must read complete instructions/literature along with all the possible adverse reactions/side effects for all the Medicines you take and that have been prescribed to you. Take any new Medicines after you have completely understood and accept all the possible adverse reactions/side effects.   Please note  You were cared for by a hospitalist during your hospital stay. If you have any questions about your discharge medications or the care you received while you were in the hospital after you are discharged, you can call the unit and asked to speak with the hospitalist on call if the hospitalist that took care of you is not available. Once you are discharged, your primary care physician will handle any further medical issues. Please note that NO REFILLS for any discharge medications will be authorized once you are  discharged, as it is imperative that you return to your primary care physician (or establish a relationship with a primary care physician if you do not have one) for your aftercare needs so that they can reassess your need for medications and monitor your lab values.    Today   CHIEF COMPLAINT:   Chief Complaint  Patient presents with  . low hemoglobin    HISTORY OF PRESENT ILLNESS:  Gina Nguyen  is a 65 y.o. female sent in with low hemoglobin   VITAL SIGNS:  Blood pressure (!) 157/65, pulse 74, temperature 98.4 F (36.9 C), temperature source Oral, resp. rate 16, height 5\' 5"  (1.651 m), weight 64.4 kg, SpO2 99 %.   PHYSICAL EXAMINATION:  GENERAL:  65 y.o.-year-old patient lying in the bed with no acute distress.  EYES: Pupils equal, round, reactive to light and accommodation. No scleral icterus.  HEENT: Head atraumatic, normocephalic. Oropharynx and nasopharynx clear.  LUNGS: Normal breath sounds bilaterally, no wheezing, rales,rhonchi or crepitation. No use of accessory muscles of respiration.  CARDIOVASCULAR: S1, S2 normal. No murmurs, rubs, or gallops.  ABDOMEN: Soft, non-tender, non-distended.  EXTREMITIES: No pedal edema.  NEUROLOGIC: Cranial nerves II through XII are intact. Muscle strength 5/5 in all extremities. Sensation intact. Gait not checked.  PSYCHIATRIC: The patient is alert and oriented x3.  SKIN: No obvious rash, lesion, or ulcer.   DATA REVIEW:   CBC Recent Labs  Lab 02/09/20 0324  WBC 9.7  HGB 7.7*  HCT 24.6*  PLT 354    Chemistries  Recent Labs  Lab 02/07/20 0623  NA 133*  K 3.8  CL 98  CO2 25  GLUCOSE 91  BUN 7*  CREATININE 1.00  CALCIUM 9.5     Microbiology Results  Results for orders placed or performed during the hospital encounter of 02/05/20  SARS CORONAVIRUS 2 (TAT 6-24 HRS) Nasopharyngeal Nasopharyngeal Swab     Status: None   Collection Time: 02/05/20  1:36 PM   Specimen: Nasopharyngeal Swab  Result Value Ref Range Status    SARS Coronavirus 2 NEGATIVE NEGATIVE Final    Comment: (NOTE) SARS-CoV-2 target nucleic acids are NOT DETECTED.  The SARS-CoV-2 RNA is generally detectable in upper and lower respiratory specimens during the acute phase of infection. Negative results do not preclude SARS-CoV-2 infection, do not rule out co-infections with other pathogens, and should not be used as the sole basis for treatment or other patient management decisions. Negative results must be combined with clinical observations, patient history, and epidemiological information. The expected result is Negative.  Fact Sheet for Patients: HairSlick.no  Fact Sheet for Healthcare Providers: quierodirigir.com  This test is not yet approved or cleared by the [place] FDA and  has been authorized for detection and/or diagnosis of SARS-CoV-2 by FDA under an Emergency Use Authorization (EUA). This EUA will remain  in effect (meaning this test can be used) for the duration of the COVID-19 declaration under Se ction 564(b)(1) of the Act, 21 U.S.C. section 360bbb-3(b)(1), unless the authorization is terminated or revoked sooner.  Performed at [facility], [address], [place], [place] [address]        Management plans discussed with the patient, family and they are in agreement.  CODE STATUS:     Code Status Orders  (From admission, onward)         Start     Ordered   02/05/20 1415  Full code  Continuous        02/05/20 1416        Code Status History    Date Active Date Inactive Code Status Order ID Comments User Context   01/15/2020 1124 01/15/2020 1754 Full Code [id number]  Schnier, Latina Craver, MD Inpatient   Advance Care Planning Activity      TOTAL TIME TAKING CARE OF THIS PATIENT: 35 minutes.    Alford Highland M.D on 02/09/2020 at 5:27 PM  Between 7am to 6pm - Pager - [phone]  After 6pm go to [email or web address] - password [email or web address]  [facility]  [facility]  CC: Primary care physician; Lauro Regulus, MD

## 2020-02-09 NOTE — Plan of Care (Signed)
  Problem: Education: Goal: Ability to identify signs and symptoms of gastrointestinal bleeding will improve Outcome: Adequate for Discharge   Problem: Bowel/Gastric: Goal: Will show no signs and symptoms of gastrointestinal bleeding Outcome: Adequate for Discharge   Problem: Fluid Volume: Goal: Will show no signs and symptoms of excessive bleeding Outcome: Adequate for Discharge   Problem: Clinical Measurements: Goal: Complications related to the disease process, condition or treatment will be avoided or minimized Outcome: Adequate for Discharge   

## 2020-02-09 NOTE — Discharge Instructions (Signed)
 Goldman-Cecil medicine (25th ed., pp. 1059-1068). [place], [place]: Elsevier.">  Anemia  Anemia is a condition in which there is not enough red blood cells or hemoglobin in the blood. Hemoglobin is a substance in red blood cells that carries oxygen. When you do not have enough red blood cells or hemoglobin (are anemic), your body cannot get enough oxygen and your organs may not work properly. As a result, you may feel very tired or have other problems. What are the causes? Common causes of anemia include:  Excessive bleeding. Anemia can be caused by excessive bleeding inside or outside the body, including bleeding from the intestines or from heavy menstrual periods in females.  Poor nutrition.  Long-lasting (chronic) kidney, thyroid, and liver disease.  Bone marrow disorders, spleen problems, and blood disorders.  Cancer and treatments for cancer.  HIV (human immunodeficiency virus) and AIDS (acquired immunodeficiency syndrome).  Infections, medicines, and autoimmune disorders that destroy red blood cells. What are the signs or symptoms? Symptoms of this condition include:  Minor weakness.  Dizziness.  Headache, or difficulties concentrating and sleeping.  Heartbeats that feel irregular or faster than normal (palpitations).  Shortness of breath, especially with exercise.  Pale skin, lips, and nails, or cold hands and feet.  Indigestion and nausea. Symptoms may occur suddenly or develop slowly. If your anemia is mild, you may not have symptoms. How is this diagnosed? This condition is diagnosed based on blood tests, your medical history, and a physical exam. In some cases, a test may be needed in which cells are removed from the soft tissue inside of a bone and looked at under a microscope (bone marrow biopsy). Your health care provider may also check your stool (feces) for blood and may do additional testing to look for the cause of your bleeding. Other tests may  include:  Imaging tests, such as a CT scan or MRI.  A procedure to see inside your esophagus and stomach (endoscopy).  A procedure to see inside your colon and rectum (colonoscopy). How is this treated? Treatment for this condition depends on the cause. If you continue to lose a lot of blood, you may need to be treated at a hospital. Treatment may include:  Taking supplements of iron, vitamin B12, or folic acid.  Taking a hormone medicine (erythropoietin) that can help to stimulate red blood cell growth.  Having a blood transfusion. This may be needed if you lose a lot of blood.  Making changes to your diet.  Having surgery to remove your spleen. Follow these instructions at home:  Take over-the-counter and prescription medicines only as told by your health care provider.  Take supplements only as told by your health care provider.  Follow any diet instructions that you were given by your health care provider.  Keep all follow-up visits as told by your health care provider. This is important. Contact a health care provider if:  You develop new bleeding anywhere in the body. Get help right away if:  You are very weak.  You are short of breath.  You have pain in your abdomen or chest.  You are dizzy or feel faint.  You have trouble concentrating.  You have bloody stools, black stools, or tarry stools.  You vomit repeatedly or you vomit up blood. These symptoms may represent a serious problem that is an emergency. Do not wait to see if the symptoms will go away. Get medical help right away. Call your local emergency services (911 in the [place] Do   not drive yourself to the hospital. Summary  Anemia is a condition in which you do not have enough red blood cells or enough of a substance in your red blood cells that carries oxygen (hemoglobin).  Symptoms may occur suddenly or develop slowly.  If your anemia is mild, you may not have symptoms.  This condition is  diagnosed with blood tests, a medical history, and a physical exam. Other tests may be needed.  Treatment for this condition depends on the cause of the anemia. This information is not intended to replace advice given to you by your health care provider. Make sure you discuss any questions you have with your health care provider. Document Revised: 11/28/2018 Document Reviewed: 11/28/2018 Elsevier Patient Education  [place].   Goldman-Cecil medicine (25th ed., pp. [phone]). [place], [place]: Elsevier.">  Anemia  Anemia is a condition in which there is not enough red blood cells or hemoglobin in the blood. Hemoglobin is a substance in red blood cells that carries oxygen. When you do not have enough red blood cells or hemoglobin (are anemic), your body cannot get enough oxygen and your organs may not work properly. As a result, you may feel very tired or have other problems. What are the causes? Common causes of anemia include:  Excessive bleeding. Anemia can be caused by excessive bleeding inside or outside the body, including bleeding from the intestines or from heavy menstrual periods in females.  Poor nutrition.  Long-lasting (chronic) kidney, thyroid, and liver disease.  Bone marrow disorders, spleen problems, and blood disorders.  Cancer and treatments for cancer.  HIV (human immunodeficiency virus) and AIDS (acquired immunodeficiency syndrome).  Infections, medicines, and autoimmune disorders that destroy red blood cells. What are the signs or symptoms? Symptoms of this condition include:  Minor weakness.  Dizziness.  Headache, or difficulties concentrating and sleeping.  Heartbeats that feel irregular or faster than normal (palpitations).  Shortness of breath, especially with exercise.  Pale skin, lips, and nails, or cold hands and feet.  Indigestion and nausea. Symptoms may occur suddenly or develop slowly. If your anemia is mild, you may not have  symptoms. How is this diagnosed? This condition is diagnosed based on blood tests, your medical history, and a physical exam. In some cases, a test may be needed in which cells are removed from the soft tissue inside of a bone and looked at under a microscope (bone marrow biopsy). Your health care provider may also check your stool (feces) for blood and may do additional testing to look for the cause of your bleeding. Other tests may include:  Imaging tests, such as a CT scan or MRI.  A procedure to see inside your esophagus and stomach (endoscopy).  A procedure to see inside your colon and rectum (colonoscopy). How is this treated? Treatment for this condition depends on the cause. If you continue to lose a lot of blood, you may need to be treated at a hospital. Treatment may include:  Taking supplements of iron, vitamin J85, or folic acid.  Taking a hormone medicine (erythropoietin) that can help to stimulate red blood cell growth.  Having a blood transfusion. This may be needed if you lose a lot of blood.  Making changes to your diet.  Having surgery to remove your spleen. Follow these instructions at home:  Take over-the-counter and prescription medicines only as told by your health care provider.  Take supplements only as told by your health care provider.  Follow any diet instructions that  you were given by your health care provider.  Keep all follow-up visits as told by your health care provider. This is important. Contact a health care provider if:  You develop new bleeding anywhere in the body. Get help right away if:  You are very weak.  You are short of breath.  You have pain in your abdomen or chest.  You are dizzy or feel faint.  You have trouble concentrating.  You have bloody stools, black stools, or tarry stools.  You vomit repeatedly or you vomit up blood. These symptoms may represent a serious problem that is an emergency. Do not wait to see if the  symptoms will go away. Get medical help right away. Call your local emergency services (911 in the [place] Do not drive yourself to the hospital. Summary  Anemia is a condition in which you do not have enough red blood cells or enough of a substance in your red blood cells that carries oxygen (hemoglobin).  Symptoms may occur suddenly or develop slowly.  If your anemia is mild, you may not have symptoms.  This condition is diagnosed with blood tests, a medical history, and a physical exam. Other tests may be needed.  Treatment for this condition depends on the cause of the anemia. This information is not intended to replace advice given to you by your health care provider. Make sure you discuss any questions you have with your health care provider. Document Revised: 11/28/2018 Document Reviewed: 11/28/2018 Elsevier Patient Education  2021 [place] of on plavix one week.

## 2020-02-09 NOTE — Anesthesia Postprocedure Evaluation (Signed)
Anesthesia Post Note  Patient: SHYAN SCALISI  Procedure(s) Performed: ESOPHAGOGASTRODUODENOSCOPY (EGD) (N/A ) COLONOSCOPY (N/A )  Patient location during evaluation: PACU Anesthesia Type: General Level of consciousness: awake and alert Pain management: pain level controlled Vital Signs Assessment: post-procedure vital signs reviewed and stable Respiratory status: spontaneous breathing, nonlabored ventilation, respiratory function stable and patient connected to nasal cannula oxygen Cardiovascular status: blood pressure returned to baseline and stable Postop Assessment: no apparent nausea or vomiting Anesthetic complications: no   No complications documented.   Last Vitals:  Vitals:   02/09/20 0845 02/09/20 0849  BP: (!) 142/74 (!) 142/74  Pulse: (!) 102 (!) 104  Resp:  (!) 24  Temp: (!) 36.4 C (!) 36.4 C  SpO2: 97% 99%    Last Pain:  Vitals:   02/09/20 0845  TempSrc: Temporal  PainSc: 0-No pain                 Corinda Gubler

## 2020-02-11 ENCOUNTER — Encounter: Payer: Self-pay | Admitting: Gastroenterology

## 2020-02-13 DIAGNOSIS — D509 Iron deficiency anemia, unspecified: Secondary | ICD-10-CM | POA: Insufficient documentation

## 2020-02-21 ENCOUNTER — Encounter (INDEPENDENT_AMBULATORY_CARE_PROVIDER_SITE_OTHER): Payer: Self-pay | Admitting: Vascular Surgery

## 2020-02-21 ENCOUNTER — Ambulatory Visit (INDEPENDENT_AMBULATORY_CARE_PROVIDER_SITE_OTHER): Payer: 59 | Admitting: Vascular Surgery

## 2020-02-21 ENCOUNTER — Other Ambulatory Visit: Payer: Self-pay

## 2020-02-21 VITALS — BP 158/83 | HR 78 | Resp 16 | Wt 138.4 lb

## 2020-02-21 DIAGNOSIS — I70213 Atherosclerosis of native arteries of extremities with intermittent claudication, bilateral legs: Secondary | ICD-10-CM | POA: Diagnosis not present

## 2020-02-21 DIAGNOSIS — E785 Hyperlipidemia, unspecified: Secondary | ICD-10-CM

## 2020-02-21 DIAGNOSIS — I10 Essential (primary) hypertension: Secondary | ICD-10-CM | POA: Diagnosis not present

## 2020-02-21 DIAGNOSIS — M47817 Spondylosis without myelopathy or radiculopathy, lumbosacral region: Secondary | ICD-10-CM

## 2020-02-21 NOTE — Progress Notes (Signed)
MRN : [id number]  Gina Nguyen is a 65 y.o. (1955-09-27) female who presents with chief complaint of  Chief Complaint  Patient presents with  . Follow-up    [facility] 3wk post le angio  .  History of Present Illness:   The patient returns to the office for followup and review status post angiogram, no intervention was performed.  The primary lesion identified was at the common femoral bifurcation extending into the origin of the SFA and profunda.  The patient notes improvement in the lower extremity symptoms, she states she has been walking better than ever. No interval shortening of the patient's claudication distance or rest pain symptoms. Previous wounds have now healed.  No new ulcers or wounds have occurred since the last visit.    left lower extremity distal runoff third order catheter placement was performed 01/15/2019.  There have been no significant changes to the patient's overall health care.  The patient denies amaurosis fugax or recent TIA symptoms. There are no recent neurological changes noted. The patient denies history of DVT, PE or superficial thrombophlebitis. The patient denies recent episodes of angina or shortness of breath.     Current Meds  Medication Sig  . amLODipine (NORVASC) 5 MG tablet Take 5 mg by mouth daily.  . butalbital-acetaminophen-caffeine (FIORICET) 50-325-40 MG tablet Take 1 tablet by mouth every 6 (six) hours as needed for headache or migraine.  . clopidogrel (PLAVIX) 75 MG tablet Take by mouth.  . Cyanocobalamin 1000 MCG TBCR Take 1,000 mcg by mouth daily.  Marland Kitchen lisinopril (PRINIVIL,ZESTRIL) 20 MG tablet Take 20 mg by mouth daily.  Marland Kitchen neomycin-polymyxin b-dexamethasone (MAXITROL) 3.5-10000-0.1 SUSP Place 1 drop into both eyes in the morning and at bedtime.  . nicotine (NICODERM CQ - DOSED IN MG/24 HR) 7 mg/24hr patch 1 patch chest wall daily (okay use substitute)  . pantoprazole (PROTONIX) 40 MG tablet Take 1 tablet (40 mg total) by mouth 2 (two)  times daily.  . pravastatin (PRAVACHOL) 40 MG tablet Take 40 mg by mouth at bedtime.  . propranolol (INDERAL) 40 MG tablet Take 1 tablet by mouth 2 (two) times daily.  Marland Kitchen pyridoxine (B-6) 100 MG tablet Take 100 mg by mouth daily.  . SUMAtriptan (IMITREX) 100 MG tablet Take by mouth.  . topiramate (TOPAMAX) 25 MG tablet Take 25 mg by mouth 2 (two) times daily.  Marland Kitchen torsemide (DEMADEX) 10 MG tablet Take 10 mg by mouth daily.  . traZODone (DESYREL) 50 MG tablet Take 50-100 mg by mouth at bedtime as needed for sleep.    Past Medical History:  Diagnosis Date  . Allergy   . Arthritis   . Hypercholesterolemia   . Hypertension   . Peripheral vascular disease [facility])     Past Surgical History:  Procedure Laterality Date  . ABDOMINAL HYSTERECTOMY    . BREAST CYST ASPIRATION Right   . COLONOSCOPY N/A 02/09/2020   Procedure: COLONOSCOPY;  Surgeon: Pasty Spillers, MD;  Location: [facility] ENDOSCOPY;  Service: Gastroenterology;  Laterality: N/A;  . ESOPHAGOGASTRODUODENOSCOPY N/A 02/09/2020   Procedure: ESOPHAGOGASTRODUODENOSCOPY (EGD);  Surgeon: Pasty Spillers, MD;  Location: [facility] ENDOSCOPY;  Service: Gastroenterology;  Laterality: N/A;  . LOWER EXTREMITY ANGIOGRAPHY Left 01/15/2020   Procedure: LOWER EXTREMITY ANGIOGRAPHY;  Surgeon: Renford Dills, MD;  Location: [facility] INVASIVE CV LAB;  Service: Cardiovascular;  Laterality: Left;    Social History Social History   Tobacco Use  . Smoking status: Former Smoker    Types: Cigarettes  . Smokeless  tobacco: Former Neurosurgeon    Types: Snuff, Chew  Substance Use Topics  . Alcohol use: No  . Drug use: No    Family History Family History  Problem Relation Age of Onset  . Breast cancer Mother 72    Allergies  Allergen Reactions  . Influenza Vaccines     Other reaction(s): Other (See Comments) Allergy to eggs  . Crab Extract Allergy Skin Test     Vomiting   . Eggs Or Egg-Derived Products Swelling    Vomiting, swelling of stomach per  patient   . Shrimp (Diagnostic)     vomiting     REVIEW OF SYSTEMS (Negative unless checked)  Constitutional: [] Weight loss  [] Fever  [] Chills Cardiac: [] Chest pain   [] Chest pressure   [] Palpitations   [] Shortness of breath when laying flat   [] Shortness of breath with exertion. Vascular:  [] Pain in legs with walking   [] Pain in legs at rest  [] History of DVT   [] Phlebitis   [] Swelling in legs   [] Varicose veins   [] Non-healing ulcers Pulmonary:   [] Uses home oxygen   [] Productive cough   [] Hemoptysis   [] Wheeze  [] COPD   [] Asthma Neurologic:  [] Dizziness   [] Seizures   [] History of stroke   [] History of TIA  [] Aphasia   [] Vissual changes   [] Weakness or numbness in arm   [] Weakness or numbness in leg Musculoskeletal:   [] Joint swelling   [] Joint pain   [] Low back pain Hematologic:  [] Easy bruising  [] Easy bleeding   [] Hypercoagulable state   [] Anemic Gastrointestinal:  [] Diarrhea   [] Vomiting  [] Gastroesophageal reflux/heartburn   [] Difficulty swallowing. Genitourinary:  [] Chronic kidney disease   [] Difficult urination  [] Frequent urination   [] Blood in urine Skin:  [] Rashes   [] Ulcers  Psychological:  [] History of anxiety   []  History of major depression.  Physical Examination  Vitals:   02/21/20 0827  BP: (!) 158/83  Pulse: 78  Resp: 16  Weight: 138 lb 6.4 oz (62.8 kg)   Body mass index is 23.03 kg/m. Gen: WD/WN, NAD Head: [place]/AT, No temporalis wasting.  Ear/Nose/Throat: Hearing grossly intact, nares w/o erythema or drainage Eyes: PER, EOMI, sclera nonicteric.  Neck: Supple, no large masses.   Pulmonary:  Good air movement, no audible wheezing bilaterally, no use of accessory muscles.  Cardiac: RRR, no JVD Vascular:  Vessel Right Left  Radial Palpable Palpable  PT Not Palpable Not Palpable  DP Not Palpable Not Palpable  Gastrointestinal: Non-distended. No guarding/no peritoneal signs.  Musculoskeletal: M/S 5/5 throughout.  No deformity or atrophy.  Neurologic: CN 2-12  intact. Symmetrical.  Speech is fluent. Motor exam as listed above. Psychiatric: Judgment intact, Mood & affect appropriate for pt's clinical situation. Dermatologic: No rashes or ulcers noted.  No changes consistent with cellulitis.   CBC Lab Results  Component Value Date   WBC 9.7 02/09/2020   HGB 7.7 (L) 02/09/2020   HCT 24.6 (L) 02/09/2020   MCV 73.7 (L) 02/09/2020   PLT 354 02/09/2020    BMET    Component Value Date/Time   NA 133 (L) 02/07/2020 0623   K 3.8 02/07/2020 0623   CL 98 02/07/2020 0623   CO2 25 02/07/2020 0623   GLUCOSE 91 02/07/2020 0623   BUN 7 (L) 02/07/2020 0623   BUN 15 10/17/2012 0805   CREATININE 1.00 02/07/2020 0623   CREATININE 1.16 10/17/2012 0805   CALCIUM 9.5 02/07/2020 0623   GFRNONAA >60 02/07/2020 0623   GFRNONAA 51 (L) 10/17/2012 0805  GFRAA 60 (L) 10/17/2012 0805   Estimated Creatinine Clearance: 49.1 mL/min (by C-G formula based on SCr of 1 mg/dL).  COAG Lab Results  Component Value Date   INR 1.1 02/05/2020    Radiology DG Chest Port 1 View  Result Date: 02/05/2020 CLINICAL DATA:  Shortness of breath. EXAM: PORTABLE CHEST 1 VIEW COMPARISON:  None. FINDINGS: The heart size and mediastinal contours are within normal limits. Both lungs are clear. The visualized skeletal structures are unremarkable. IMPRESSION: No active disease. Electronically Signed   By: Obie Dredge M.D.   On: 02/05/2020 14:24     Assessment/Plan 1. Atherosclerosis of native artery of both lower extremities with intermittent claudication (HCC)  Recommend:  The patient has evidence of atherosclerosis of the lower extremities with claudication.  The patient does not voice lifestyle limiting changes at this point in time.  Noninvasive studies do not suggest clinically significant change.  No invasive studies, angiography or surgery at this time The patient should continue walking and begin a more formal exercise program.  The patient should continue  antiplatelet therapy and aggressive treatment of the lipid abnormalities  No changes in the patient's medications at this time  The patient should continue wearing graduated compression socks 10-15 mmHg strength to control the mild edema.   - VAS [place] ABI WITH/WO TBI; Future  2. Essential hypertension Continue antihypertensive medications as already ordered, these medications have been reviewed and there are no changes at this time.   3. Hyperlipidemia, unspecified hyperlipidemia type Continue statin as ordered and reviewed, no changes at this time   4. Lumbar and sacral osteoarthritis Continue NSAID medications as already ordered, these medications have been reviewed and there are no changes at this time.  Continued activity and therapy was stressed.    Levora Dredge, MD  02/21/2020 8:34 AM

## 2020-02-22 ENCOUNTER — Encounter (INDEPENDENT_AMBULATORY_CARE_PROVIDER_SITE_OTHER): Payer: Self-pay | Admitting: Vascular Surgery

## 2020-02-24 NOTE — Progress Notes (Signed)
[facility]  [phone] [phone]  ID: DEMEKA SUTTER OB: August 21, 1955  MR#: [id number]  KVQ#:[id number]  Patient Care Team: Lauro Regulus, MD as PCP - General (Internal Medicine)  CHIEF COMPLAINT: Symptomatic anemia.  INTERVAL HISTORY: Patient is a 65 year old female who was recently admitted to the hospital with significant weakness and fatigue and found to have a hemoglobin of less than 4.0.  After multiple blood transfusions, she feels improved and nearly back to her baseline.  Colonoscopy and EGD at that time revealed several angiectasias, but no other pathology.  She currently feels well.  She has no neurologic complaints.  She denies any recent fevers or illnesses.  She has a good appetite and denies weight loss.  She has no chest pain, shortness of breath, cough, or hemoptysis.  She denies any nausea, vomiting, constipation, or diarrhea.  She has no melena or hematochezia.  She has no urinary complaints.  Patient feels at her baseline offers no specific complaints today.  REVIEW OF SYSTEMS:   Review of Systems  Constitutional: Negative.  Negative for fever, malaise/fatigue and weight loss.  Respiratory: Negative.  Negative for cough, hemoptysis and shortness of breath.   Cardiovascular: Negative.  Negative for chest pain and leg swelling.  Gastrointestinal: Negative.  Negative for abdominal pain, blood in stool and melena.  Genitourinary: Negative.  Negative for hematuria.  Musculoskeletal: Negative.  Negative for back pain.  Skin: Negative.  Negative for rash.  Neurological: Negative.  Negative for dizziness, focal weakness, weakness and headaches.  Psychiatric/Behavioral: Negative.  The patient is not nervous/anxious.     As per HPI. Otherwise, a complete review of systems is negative.  PAST MEDICAL HISTORY: Past Medical History:  Diagnosis Date  . Allergy   . Arthritis   . Hypercholesterolemia   . Hypertension   . Peripheral  vascular disease (HCC)     PAST SURGICAL HISTORY: Past Surgical History:  Procedure Laterality Date  . ABDOMINAL HYSTERECTOMY    . BREAST CYST ASPIRATION Right   . COLONOSCOPY N/A 02/09/2020   Procedure: COLONOSCOPY;  Surgeon: Pasty Spillers, MD;  Location: [facility] ENDOSCOPY;  Service: Gastroenterology;  Laterality: N/A;  . ESOPHAGOGASTRODUODENOSCOPY N/A 02/09/2020   Procedure: ESOPHAGOGASTRODUODENOSCOPY (EGD);  Surgeon: Pasty Spillers, MD;  Location: [facility] ENDOSCOPY;  Service: Gastroenterology;  Laterality: N/A;  . LOWER EXTREMITY ANGIOGRAPHY Left 01/15/2020   Procedure: LOWER EXTREMITY ANGIOGRAPHY;  Surgeon: Renford Dills, MD;  Location: [facility] INVASIVE CV LAB;  Service: Cardiovascular;  Laterality: Left;    FAMILY HISTORY: Family History  Problem Relation Age of Onset  . Breast cancer Mother 80    ADVANCED DIRECTIVES (Y/N):  N  HEALTH MAINTENANCE: Social History   Tobacco Use  . Smoking status: Former Smoker    Types: Cigarettes  . Smokeless tobacco: Former Neurosurgeon    Types: Snuff, Chew  Substance Use Topics  . Alcohol use: No  . Drug use: No     Colonoscopy:  PAP:  Bone density:  Lipid panel:  Allergies  Allergen Reactions  . Influenza Vaccines     Other reaction(s): Other (See Comments) Allergy to eggs  . Crab Extract Allergy Skin Test     Vomiting   . Eggs Or Egg-Derived Products Swelling    Vomiting, swelling of stomach per patient   . Shrimp (Diagnostic)     vomiting    Current Outpatient Medications  Medication Sig Dispense Refill  . amLODipine (NORVASC) 5 MG tablet Take 5 mg by mouth daily.    Marland Kitchen  butalbital-acetaminophen-caffeine (FIORICET) 50-325-40 MG tablet Take 1 tablet by mouth every 6 (six) hours as needed for headache or migraine. 30 tablet 0  . clopidogrel (PLAVIX) 75 MG tablet Take by mouth.    . Cyanocobalamin 1000 MCG TBCR Take 1,000 mcg by mouth daily.    Marland Kitchen lisinopril (PRINIVIL,ZESTRIL) 20 MG tablet Take 20 mg by mouth daily.     Marland Kitchen neomycin-polymyxin b-dexamethasone (MAXITROL) 3.5-10000-0.1 SUSP Place 1 drop into both eyes in the morning and at bedtime.    . nicotine (NICODERM CQ - DOSED IN MG/24 HR) 7 mg/24hr patch 1 patch chest wall daily (okay use substitute) 28 patch 0  . pantoprazole (PROTONIX) 40 MG tablet Take 1 tablet (40 mg total) by mouth 2 (two) times daily. 60 tablet 0  . pravastatin (PRAVACHOL) 40 MG tablet Take 40 mg by mouth at bedtime.    . propranolol (INDERAL) 40 MG tablet Take 1 tablet by mouth 2 (two) times daily.    Marland Kitchen pyridoxine (B-6) 100 MG tablet Take 100 mg by mouth daily.    . SUMAtriptan (IMITREX) 100 MG tablet Take by mouth.    . torsemide (DEMADEX) 10 MG tablet Take 10 mg by mouth daily.    . traZODone (DESYREL) 50 MG tablet Take 50-100 mg by mouth at bedtime as needed for sleep.     No current facility-administered medications for this visit.    OBJECTIVE: Vitals:   02/28/20 1049  BP: (!) 163/93  Pulse: 81  Resp: 18  Temp: (!) 97.5 F (36.4 C)  SpO2: 100%     Body mass index is 23.56 kg/m.    ECOG FS:0 - Asymptomatic  General: Well-developed, well-nourished, no acute distress. Eyes: Pink conjunctiva, anicteric sclera. HEENT: Normocephalic, moist mucous membranes. Lungs: No audible wheezing or coughing. Heart: Regular rate and rhythm. Abdomen: Soft, nontender, no obvious distention. Musculoskeletal: No edema, cyanosis, or clubbing. Neuro: Alert, answering all questions appropriately. Cranial nerves grossly intact. Skin: No rashes or petechiae noted. Psych: Normal affect. Lymphatics: No cervical, calvicular, axillary or inguinal LAD.   LAB RESULTS:  Lab Results  Component Value Date   NA 133 (L) 02/07/2020   K 3.8 02/07/2020   CL 98 02/07/2020   CO2 25 02/07/2020   GLUCOSE 91 02/07/2020   BUN 7 (L) 02/07/2020   CREATININE 1.00 02/07/2020   CALCIUM 9.5 02/07/2020   GFRNONAA >60 02/07/2020   GFRAA 60 (L) 10/17/2012    Lab Results  Component Value Date   WBC  6.5 02/28/2020   NEUTROABS 4.1 02/28/2020   HGB 12.6 02/28/2020   HCT 39.8 02/28/2020   MCV 77.7 (L) 02/28/2020   PLT 512 (H) 02/28/2020   Lab Results  Component Value Date   IRON 65 02/28/2020   TIBC 351 02/28/2020   IRONPCTSAT 19 02/28/2020   Lab Results  Component Value Date   FERRITIN 72 02/28/2020     STUDIES: DG Chest Port 1 View  Result Date: 02/05/2020 CLINICAL DATA:  Shortness of breath. EXAM: PORTABLE CHEST 1 VIEW COMPARISON:  None. FINDINGS: The heart size and mediastinal contours are within normal limits. Both lungs are clear. The visualized skeletal structures are unremarkable. IMPRESSION: No active disease. Electronically Signed   By: Obie Dredge M.D.   On: 02/05/2020 14:24    ASSESSMENT: Symptomatic anemia.  PLAN:    1. Symptomatic anemia: Resolved.  Patient had a significant GI bleed with hemoglobin decreasing to less than 4.0.  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  Her hemoglobin and iron stores are now within normal limits.  She does not require IV iron at this time, but has not been instructed to continue her oral iron supplementation.  Return to clinic in 3 months with repeat laboratory work and further evaluation.  I spent a total of 45 minutes reviewing chart data, face-to-face evaluation with the patient, counseling and coordination of care as detailed above.   Patient expressed understanding and was in agreement with this plan. She also understands that She can call clinic at any time with any questions, concerns, or complaints.    Jeralyn Ruths, MD   02/29/2020 6:20 AM

## 2020-02-28 ENCOUNTER — Inpatient Hospital Stay: Payer: 59 | Attending: Oncology | Admitting: Oncology

## 2020-02-28 ENCOUNTER — Inpatient Hospital Stay: Payer: 59

## 2020-02-28 ENCOUNTER — Encounter: Payer: Self-pay | Admitting: Oncology

## 2020-02-28 VITALS — BP 163/93 | HR 81 | Temp 97.5°F | Resp 18 | Ht 65.0 in | Wt 141.6 lb

## 2020-02-28 DIAGNOSIS — Z862 Personal history of diseases of the blood and blood-forming organs and certain disorders involving the immune mechanism: Secondary | ICD-10-CM | POA: Insufficient documentation

## 2020-02-28 DIAGNOSIS — Z87891 Personal history of nicotine dependence: Secondary | ICD-10-CM | POA: Insufficient documentation

## 2020-02-28 DIAGNOSIS — D649 Anemia, unspecified: Secondary | ICD-10-CM

## 2020-02-28 LAB — CBC WITH DIFFERENTIAL/PLATELET
Abs Immature Granulocytes: 0.01 10*3/uL (ref 0.00–0.07)
Basophils Absolute: 0 10*3/uL (ref 0.0–0.1)
Basophils Relative: 0 %
Eosinophils Absolute: 0.1 10*3/uL (ref 0.0–0.5)
Eosinophils Relative: 2 %
HCT: 39.8 % (ref 36.0–46.0)
Hemoglobin: 12.6 g/dL (ref 12.0–15.0)
Immature Granulocytes: 0 %
Lymphocytes Relative: 27 %
Lymphs Abs: 1.8 10*3/uL (ref 0.7–4.0)
MCH: 24.6 pg — ABNORMAL LOW (ref 26.0–34.0)
MCHC: 31.7 g/dL (ref 30.0–36.0)
MCV: 77.7 fL — ABNORMAL LOW (ref 80.0–100.0)
Monocytes Absolute: 0.5 10*3/uL (ref 0.1–1.0)
Monocytes Relative: 8 %
Neutro Abs: 4.1 10*3/uL (ref 1.7–7.7)
Neutrophils Relative %: 63 %
Platelets: 512 10*3/uL — ABNORMAL HIGH (ref 150–400)
RBC: 5.12 MIL/uL — ABNORMAL HIGH (ref 3.87–5.11)
Smear Review: NORMAL
WBC: 6.5 10*3/uL (ref 4.0–10.5)
nRBC: 0 % (ref 0.0–0.2)

## 2020-02-28 LAB — IRON AND TIBC
Iron: 65 ug/dL (ref 28–170)
Saturation Ratios: 19 % (ref 10.4–31.8)
TIBC: 351 ug/dL (ref 250–450)
UIBC: 286 ug/dL

## 2020-02-28 LAB — FOLATE: Folate: 7.2 ng/mL (ref >5.9)

## 2020-02-28 LAB — FERRITIN: Ferritin: 72 ng/mL (ref 11–307)

## 2020-02-28 LAB — VITAMIN B12: Vitamin B-12: 2356 pg/mL — ABNORMAL HIGH (ref 180–914)

## 2020-02-28 NOTE — Progress Notes (Signed)
New pt referred for anemia.

## 2020-02-29 ENCOUNTER — Telehealth: Payer: Self-pay

## 2020-03-03 ENCOUNTER — Inpatient Hospital Stay: Payer: 59

## 2020-03-07 ENCOUNTER — Telehealth: Payer: Self-pay | Admitting: Oncology

## 2020-03-07 NOTE — Telephone Encounter (Signed)
Pt called and stated she would be out of town during appts for (3 months: lab on day 1, see FINN with venofer on day 2) on 5/23 and 5/24. Appts moved to 6/6 and 6/7. Sending updated AVS in mail.

## 2020-03-17 ENCOUNTER — Ambulatory Visit: Payer: 59

## 2020-04-02 ENCOUNTER — Ambulatory Visit: Payer: 59 | Admitting: Gastroenterology

## 2020-04-02 ENCOUNTER — Other Ambulatory Visit: Payer: Self-pay

## 2020-04-02 ENCOUNTER — Encounter: Payer: Self-pay | Admitting: Gastroenterology

## 2020-04-02 VITALS — BP 149/87 | HR 96 | Temp 98.3°F | Ht 65.0 in | Wt 136.4 lb

## 2020-04-02 DIAGNOSIS — D509 Iron deficiency anemia, unspecified: Secondary | ICD-10-CM

## 2020-04-02 DIAGNOSIS — K219 Gastro-esophageal reflux disease without esophagitis: Secondary | ICD-10-CM

## 2020-04-02 MED ORDER — PANTOPRAZOLE SODIUM 40 MG PO TBEC
40.0000 mg | DELAYED_RELEASE_TABLET | Freq: Every day | ORAL | 0 refills | Status: DC
Start: 2020-04-02 — End: 2020-04-11

## 2020-04-02 NOTE — Patient Instructions (Signed)
Please take Protonix one tablet once a day for 30 days and then stop.

## 2020-04-02 NOTE — Progress Notes (Signed)
[name], MD [address]  [address]  [place], [place] [address]  Main: [phone]  Fax: [phone]   Primary Care Physician: Lauro Regulus, MD   Chief Complaint  Patient presents with  . Anemia    HPI: Gina Nguyen is a 65 y.o. female here for posthospitalization follow-up for acute anemia.  Patient underwent EGD and colonoscopy with nonbleeding AVMs seen in the duodenum and colon that was cauterized. The patient denies abdominal or flank pain, anorexia, nausea or vomiting, dysphagia, change in bowel habits or black or bloody stools or weight loss.  Hemoglobin is significantly improved and was normal at 12.6 on recent check on 02/28/2020.  It was as low as 3.7 on hospital admission 02/05/2020.  Upper endoscopy showed widely patent Schatzki's ring.  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  Patient is denying dysphagia  Colonoscopy showed fair prep, and a single AVM in the colon that was treated with APC.  Diverticulosis seen  Patient is also on PPI twice daily and states this is started prior to the hospital admission as she was having significant heartburn and with this medication this is improved significantly.  Current Outpatient Medications  Medication Sig Dispense Refill  . amLODipine (NORVASC) 5 MG tablet Take 5 mg by mouth daily.    . clopidogrel (PLAVIX) 75 MG tablet Take by mouth.    . Cyanocobalamin 1000 MCG TBCR Take 1,000 mcg by mouth daily.    Marland Kitchen lisinopril (PRINIVIL,ZESTRIL) 20 MG tablet Take 20 mg by mouth daily.    Marland Kitchen neomycin-polymyxin b-dexamethasone (MAXITROL) 3.5-10000-0.1 SUSP Place 1 drop into both eyes in the morning and at bedtime.    . nicotine (NICODERM CQ - DOSED IN MG/24 HR) 7 mg/24hr patch 1 patch chest wall daily (okay use substitute) 28 patch 0  . nortriptyline (PAMELOR) 10 MG capsule Take 1 capsule nightly for one week, then increase to 2 capsules nightly.    . pantoprazole (PROTONIX) 40 MG tablet  Take 1 tablet (40 mg total) by mouth daily. Then after the 30 days, you stop taking. 30 tablet 0  . pravastatin (PRAVACHOL) 40 MG tablet Take 40 mg by mouth at bedtime.    . propranolol (INDERAL) 40 MG tablet Take 1 tablet by mouth 2 (two) times daily.    Marland Kitchen pyridoxine (B-6) 100 MG tablet Take 100 mg by mouth daily.    . SUMAtriptan (IMITREX) 100 MG tablet Take by mouth.    . torsemide (DEMADEX) 10 MG tablet Take 10 mg by mouth daily.    . traZODone (DESYREL) 50 MG tablet Take 50-100 mg by mouth at bedtime as needed for sleep.     No current facility-administered medications for this visit.    Allergies as of 04/02/2020 - Review Complete 04/02/2020  Allergen Reaction Noted  . Influenza vaccines  10/26/2018  . Crab extract allergy skin test  01/15/2020  . Eggs or egg-derived products Swelling 11/29/2016  . Shrimp (diagnostic)  01/15/2020    ROS:  General: Negative for anorexia, weight loss, fever, chills, fatigue, weakness. ENT: Negative for hoarseness, difficulty swallowing , nasal congestion. CV: Negative for chest pain, angina, palpitations, dyspnea on exertion, peripheral edema.  Respiratory: Negative for dyspnea at rest, dyspnea on exertion, cough, sputum, wheezing.  GI: See history of present illness. GU:  Negative for dysuria, hematuria, urinary incontinence, urinary frequency, nocturnal urination.  Endo: Negative for unusual weight change.    Physical Examination:   BP (!) 149/87  Pulse 96   Temp 98.3 F (36.8 C) (Oral)   Ht 5\' 5"  (1.651 m)   Wt 136 lb 6.4 oz (61.9 kg)   BMI 22.70 kg/m   General: Well-nourished, well-developed in no acute distress.  Eyes: No icterus. Conjunctivae pink. Mouth: Oropharyngeal mucosa moist and pink , no lesions erythema or exudate. Neck: Supple, Trachea midline Abdomen: Bowel sounds are normal, nontender, nondistended, no hepatosplenomegaly or masses, no abdominal bruits or hernia , no rebound or guarding.   Extremities: No lower  extremity edema. No clubbing or deformities. Neuro: Alert and oriented x 3.  Grossly intact. Skin: Warm and dry, no jaundice.   Psych: Alert and cooperative, normal mood and affect.   Labs: CMP     Component Value Date/Time   NA 133 (L) 02/07/2020 0623   K 3.8 02/07/2020 0623   CL 98 02/07/2020 0623   CO2 25 02/07/2020 0623   GLUCOSE 91 02/07/2020 0623   BUN 7 (L) 02/07/2020 0623   BUN 15 10/17/2012 0805   CREATININE 1.00 02/07/2020 0623   CREATININE 1.16 10/17/2012 0805   CALCIUM 9.5 02/07/2020 0623   GFRNONAA >60 02/07/2020 0623   GFRNONAA 51 (L) 10/17/2012 0805   GFRAA 60 (L) 10/17/2012 0805   Lab Results  Component Value Date   WBC 6.5 02/28/2020   HGB 12.6 02/28/2020   HCT 39.8 02/28/2020   MCV 77.7 (L) 02/28/2020   PLT 512 (H) 02/28/2020    Imaging Studies: No results found.  Assessment and Plan:   Gina Nguyen is a 65 y.o. y/o female here for follow-up of anemia  Hemoglobin significantly improved to 12.6 on recent check  No indication for small bowel capsule study at this time given improvement in iron labs and hemoglobin after treatment of AVMs and iron replacement  If Anemia reoccurs, consider small bowel capsule study  Decrease PPI to once daily for the next month and then discontinue if symptoms remain uncontrolled as far as her heartburn.  Patient to follow-up in 2 to 3 months to assess heartburn symptoms  Since fair prep was noted on recent colonoscopy, consider repeat colonoscopy in 6 to 12 months for screening  Dr 79

## 2020-04-08 ENCOUNTER — Encounter: Payer: Self-pay | Admitting: Gastroenterology

## 2020-04-08 ENCOUNTER — Ambulatory Visit (INDEPENDENT_AMBULATORY_CARE_PROVIDER_SITE_OTHER): Payer: 59 | Admitting: Gastroenterology

## 2020-04-08 ENCOUNTER — Other Ambulatory Visit: Payer: Self-pay

## 2020-04-08 VITALS — BP 143/91 | HR 87 | Temp 98.4°F | Ht 65.0 in | Wt 131.6 lb

## 2020-04-08 DIAGNOSIS — R1011 Right upper quadrant pain: Secondary | ICD-10-CM | POA: Diagnosis not present

## 2020-04-08 DIAGNOSIS — D509 Iron deficiency anemia, unspecified: Secondary | ICD-10-CM

## 2020-04-08 NOTE — Patient Instructions (Signed)
Please do not have anything to eat or drink after midnight.

## 2020-04-09 NOTE — Progress Notes (Signed)
[name], MD [address]  [address]  [place], [place] [address]  Main: [phone]  Fax: [phone]   Primary Care Physician: Lauro Regulus, MD   Chief Complaint  Patient presents with  . Abdominal Pain    HPI: Gina Nguyen is a 65 y.o. female presents for evaluation of abdominal pain.  Epigastric, 5/10, dull, nonradiating.  Patient recently underwent upper and lower endoscopy for iron deficiency anemia that showed a widely patent Schatzki's ring.  Patient is denying dysphagia.  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  Hemoglobin has improved and is normal since then.  Colonoscopy showed a fair prep and a single AVM in the colon that was ultimately treated with APC.  Diverticulosis was seen.  Patient is denying any episodes of bleeding.  Patient was advised to decrease her PPI from twice to once daily on last visit  Current Outpatient Medications  Medication Sig Dispense Refill  . amLODipine (NORVASC) 5 MG tablet Take 5 mg by mouth daily.    . clopidogrel (PLAVIX) 75 MG tablet Take by mouth.    Marland Kitchen lisinopril (PRINIVIL,ZESTRIL) 20 MG tablet Take 20 mg by mouth daily.    Marland Kitchen neomycin-polymyxin b-dexamethasone (MAXITROL) 3.5-10000-0.1 SUSP Place 1 drop into both eyes in the morning and at bedtime.    . nicotine (NICODERM CQ - DOSED IN MG/24 HR) 7 mg/24hr patch 1 patch chest wall daily (okay use substitute) 28 patch 0  . nortriptyline (PAMELOR) 10 MG capsule Take 1 capsule nightly for one week, then increase to 2 capsules nightly.    . pantoprazole (PROTONIX) 40 MG tablet Take 1 tablet (40 mg total) by mouth daily. Then after the 30 days, you stop taking. 30 tablet 0  . pravastatin (PRAVACHOL) 40 MG tablet Take 40 mg by mouth at bedtime.    . propranolol (INDERAL) 40 MG tablet Take 1 tablet by mouth 2 (two) times daily.    Marland Kitchen pyridoxine (B-6) 100 MG tablet Take 100 mg by mouth daily.    . SUMAtriptan (IMITREX) 100 MG  tablet Take by mouth.    . torsemide (DEMADEX) 10 MG tablet Take 10 mg by mouth daily.    . traZODone (DESYREL) 50 MG tablet Take 50-100 mg by mouth at bedtime as needed for sleep.    . Cyanocobalamin 1000 MCG TBCR Take 1,000 mcg by mouth daily. (Patient not taking: Reported on 04/08/2020)     No current facility-administered medications for this visit.    Allergies as of 04/08/2020 - Review Complete 04/08/2020  Allergen Reaction Noted  . Influenza vaccines  10/26/2018  . Crab extract allergy skin test  01/15/2020  . Eggs or egg-derived products Swelling 11/29/2016  . Shrimp (diagnostic)  01/15/2020    ROS:  General: Negative for anorexia, weight loss, fever, chills, fatigue, weakness. ENT: Negative for hoarseness, difficulty swallowing , nasal congestion. CV: Negative for chest pain, angina, palpitations, dyspnea on exertion, peripheral edema.  Respiratory: Negative for dyspnea at rest, dyspnea on exertion, cough, sputum, wheezing.  GI: See history of present illness. GU:  Negative for dysuria, hematuria, urinary incontinence, urinary frequency, nocturnal urination.  Endo: Negative for unusual weight change.    Physical Examination:   BP (!) 143/91   Pulse 87   Temp 98.4 F (36.9 C) (Oral)   Ht 5\' 5"  (1.651 m)   Wt 131 lb 9.6 oz (59.7 kg)   BMI 21.90 kg/m   General: Well-nourished, well-developed in no acute  distress.  Eyes: No icterus. Conjunctivae pink. Mouth: Oropharyngeal mucosa moist and pink , no lesions erythema or exudate. Neck: Supple, Trachea midline Abdomen: Bowel sounds are normal, nontender, nondistended, no hepatosplenomegaly or masses, no abdominal bruits or hernia , no rebound or guarding.   Extremities: No lower extremity edema. No clubbing or deformities. Neuro: Alert and oriented x 3.  Grossly intact. Skin: Warm and dry, no jaundice.   Psych: Alert and cooperative, normal mood and affect.   Labs: CMP     Component Value Date/Time   NA 133 (L)  02/07/2020 0623   K 3.8 02/07/2020 0623   CL 98 02/07/2020 0623   CO2 25 02/07/2020 0623   GLUCOSE 91 02/07/2020 0623   BUN 7 (L) 02/07/2020 0623   BUN 15 10/17/2012 0805   CREATININE 1.00 02/07/2020 0623   CREATININE 1.16 10/17/2012 0805   CALCIUM 9.5 02/07/2020 0623   GFRNONAA >60 02/07/2020 0623   GFRNONAA 51 (L) 10/17/2012 0805   GFRAA 60 (L) 10/17/2012 0805   Lab Results  Component Value Date   WBC 6.5 02/28/2020   HGB 12.6 02/28/2020   HCT 39.8 02/28/2020   MCV 77.7 (L) 02/28/2020   PLT 512 (H) 02/28/2020    Imaging Studies: No results found.  Assessment and Plan:   Gina Nguyen is a 65 y.o. y/o female here for evaluation of abdominal pain  Will obtain right upper quadrant ultrasound given the location of her pain as it could be occurring from possible gallbladder issues  Obtain H. pylori serology given that patient is on PPI  Increase PPI to twice daily as symptoms seem to have started after she decreased it to once daily and continue for 30 days  After this, decrease PPI again to once daily  (Risks of PPI use were discussed with patient including bone loss, C. Diff diarrhea, pneumonia, infections, CKD, electrolyte abnormalities.  Pt. Verbalizes understanding and chooses to continue the medication.)  Patient educated extensively on acid reflux lifestyle modification, including buying a bed wedge, not eating 3 hrs before bedtime, diet modifications, and handout given for the same.   Anemia improved after treatment of AVMs   Dr [name]

## 2020-04-10 LAB — H PYLORI, IGM, IGG, IGA AB
H pylori, IgM Abs: 10.5 units — ABNORMAL HIGH (ref 0.0–8.9)
H. pylori, IgA Abs: <9 units (ref 0.0–8.9)
H. pylori, IgG AbS: 0.3 Index Value (ref 0.00–0.79)

## 2020-04-11 MED ORDER — CLARITHROMYCIN 500 MG PO TABS: 500.0000 mg | ORAL_TABLET | Freq: Two times a day (BID) | ORAL | 0 refills | Status: AC

## 2020-04-11 MED ORDER — AMOXICILLIN 500 MG PO TABS: 1000.0000 mg | ORAL_TABLET | Freq: Two times a day (BID) | ORAL | 0 refills | Status: AC

## 2020-04-11 MED ORDER — PANTOPRAZOLE SODIUM 20 MG PO TBEC: 20.0000 mg | DELAYED_RELEASE_TABLET | Freq: Two times a day (BID) | ORAL | 0 refills | Status: DC

## 2020-04-11 NOTE — Addendum Note (Signed)
Addended by: [name] on: 04/11/2020 01:52 PM   Modules accepted: Orders

## 2020-04-29 ENCOUNTER — Telehealth: Payer: Self-pay | Admitting: Gastroenterology

## 2020-04-29 NOTE — Telephone Encounter (Signed)
Patient called stating she has taken all of her meds and is still having stomach pain . Patient asks for call back.

## 2020-04-29 NOTE — Telephone Encounter (Signed)
Dr. Maximino Greenland, patient stated that she continues to have the abdominal pain as she never took the antibiotics. Patient stated that her pain is still epigastric and that it's worse after she eats. Patient denied nausea and vomiting. Patient wants to know what is her next plan since the medicine has not helped. Please advise.

## 2020-04-30 ENCOUNTER — Ambulatory Visit: Payer: 59 | Admitting: Gastroenterology

## 2020-04-30 ENCOUNTER — Telehealth: Payer: Self-pay

## 2020-04-30 ENCOUNTER — Encounter: Payer: Self-pay | Admitting: Gastroenterology

## 2020-04-30 ENCOUNTER — Other Ambulatory Visit: Payer: Self-pay

## 2020-04-30 VITALS — BP 117/80 | HR 106 | Temp 98.4°F | Ht 65.0 in | Wt 130.0 lb

## 2020-04-30 DIAGNOSIS — Z8619 Personal history of other infectious and parasitic diseases: Secondary | ICD-10-CM

## 2020-04-30 DIAGNOSIS — K5900 Constipation, unspecified: Secondary | ICD-10-CM

## 2020-04-30 DIAGNOSIS — R1013 Epigastric pain: Secondary | ICD-10-CM

## 2020-04-30 MED ORDER — LINACLOTIDE 145 MCG PO CAPS: 145.0000 ug | ORAL_CAPSULE | Freq: Every day | ORAL | 0 refills | Status: DC

## 2020-04-30 NOTE — Progress Notes (Signed)
[name], MD [address]  [address]  [place], [place] [address]  Main: [phone]  Fax: [phone]   Primary Care Physician: Lauro Regulus, MD   Chief Complaint  Patient presents with  . History of H pylori    HPI: Gina Nguyen is a 65 y.o. female here for follow-up of abdominal pain and H. pylori IgM positive.  Patient completed antibiotic therapy.  She states that the abdominal pain got better while she was taking the antibiotics.  But after day 12, abdominal pain reoccurred, but she continued to take and complete rest of the 2 days of antibiotics left.  She continues to report epigastric abdominal pain, 5/10, nonradiating, associated with nausea but no vomiting.  No dysphagia.  Reports decreased appetite due to this as well.  Does report having bowel movements only twice a week.  No blood in stool.  States MiraLAX did not do well for her and she has been taking magnesium citrate  Current Outpatient Medications  Medication Sig Dispense Refill  . amLODipine (NORVASC) 5 MG tablet Take 5 mg by mouth daily.    . clopidogrel (PLAVIX) 75 MG tablet Take by mouth.    . Cyanocobalamin 1000 MCG TBCR Take 1,000 mcg by mouth daily.    Marland Kitchen lisinopril (PRINIVIL,ZESTRIL) 20 MG tablet Take 20 mg by mouth daily.    Marland Kitchen neomycin-polymyxin b-dexamethasone (MAXITROL) 3.5-10000-0.1 SUSP Place 1 drop into both eyes in the morning and at bedtime.    . nicotine (NICODERM CQ - DOSED IN MG/24 HR) 7 mg/24hr patch 1 patch chest wall daily (okay use substitute) 28 patch 0  . nortriptyline (PAMELOR) 10 MG capsule Take 1 capsule nightly for one week, then increase to 2 capsules nightly.    . pravastatin (PRAVACHOL) 40 MG tablet Take 40 mg by mouth at bedtime.    . propranolol (INDERAL) 40 MG tablet Take 1 tablet by mouth 2 (two) times daily.    Marland Kitchen pyridoxine (B-6) 100 MG tablet Take 100 mg by mouth daily.    . SUMAtriptan (IMITREX) 100 MG tablet Take by mouth.    . torsemide (DEMADEX)  10 MG tablet Take 10 mg by mouth daily.    . traZODone (DESYREL) 50 MG tablet Take 50-100 mg by mouth at bedtime as needed for sleep.    . pantoprazole (PROTONIX) 20 MG tablet Take 1 tablet (20 mg total) by mouth 2 (two) times daily for 14 days. 28 tablet 0   No current facility-administered medications for this visit.    Allergies as of 04/30/2020 - Review Complete 04/30/2020  Allergen Reaction Noted  . Influenza vaccines  10/26/2018  . Crab extract allergy skin test  01/15/2020  . Eggs or egg-derived products Swelling 11/29/2016  . Shrimp (diagnostic)  01/15/2020    ROS:  General: Negative for anorexia, weight loss, fever, chills, fatigue, weakness. ENT: Negative for hoarseness, difficulty swallowing , nasal congestion. CV: Negative for chest pain, angina, palpitations, dyspnea on exertion, peripheral edema.  Respiratory: Negative for dyspnea at rest, dyspnea on exertion, cough, sputum, wheezing.  GI: See history of present illness. GU:  Negative for dysuria, hematuria, urinary incontinence, urinary frequency, nocturnal urination.  Endo: Negative for unusual weight change.    Physical Examination:   BP 117/80   Pulse (!) 106   Temp 98.4 F (36.9 C) (Oral)   Ht 5\' 5"  (1.651 m)   Wt 130 lb (59 kg)   BMI 21.63 kg/m   General: Well-nourished, well-developed in no  acute distress.  Eyes: No icterus. Conjunctivae pink. Mouth: Oropharyngeal mucosa moist and pink , no lesions erythema or exudate. Neck: Supple, Trachea midline Abdomen: Bowel sounds are normal, tender to palpation epigastric region, nondistended, no hepatosplenomegaly or masses, no abdominal bruits or hernia , no rebound or guarding.   Extremities: No lower extremity edema. No clubbing or deformities. Neuro: Alert and oriented x 3.  Grossly intact. Skin: Warm and dry, no jaundice.   Psych: Alert and cooperative, normal mood and affect.   Labs: CMP     Component Value Date/Time   NA 133 (L) 02/07/2020 0623    K 3.8 02/07/2020 0623   CL 98 02/07/2020 0623   CO2 25 02/07/2020 0623   GLUCOSE 91 02/07/2020 0623   BUN 7 (L) 02/07/2020 0623   BUN 15 10/17/2012 0805   CREATININE 1.00 02/07/2020 0623   CREATININE 1.16 10/17/2012 0805   CALCIUM 9.5 02/07/2020 0623   GFRNONAA >60 02/07/2020 0623   GFRNONAA 51 (L) 10/17/2012 0805   GFRAA 60 (L) 10/17/2012 0805   Lab Results  Component Value Date   WBC 6.5 02/28/2020   HGB 12.6 02/28/2020   HCT 39.8 02/28/2020   MCV 77.7 (L) 02/28/2020   PLT 512 (H) 02/28/2020    Imaging Studies: No results found.  Assessment and Plan:   Gina Nguyen is a 65 y.o. y/o female here for follow-up of abdominal pain and H. Pylori  Her abdominal pain seems out of proportion to what I would expect from H. pylori even if it is resistant and persistent after her antibiotic completion   We will check for H. pylori eradication.  Discontinue PPI at this time.  Wait 4 weeks since completion of antibiotics, and then test for H. pylori breath test.  However, given abdominal pain, proceed with right upper quadrant ultrasound.  This has been scheduled in the next 10 days  Some of her abdominal pain may be due to underlying constipation.  Start pharmacologic therapy since MiraLAX did not work well for her  High-fiber diet recommended  Dr [name]

## 2020-04-30 NOTE — Telephone Encounter (Signed)
Patient called back and spoke with Misty Stanley. Patient agreed on coming in today at 2:15 PM.

## 2020-04-30 NOTE — Telephone Encounter (Signed)
Called patient back to let her know that Dr. Maximino Greenland does not prescribe pain medications but that if her pain got worse, than to call her PCP and/or go to the ED. Patient understood and had no further questions.

## 2020-04-30 NOTE — Patient Instructions (Signed)
Please discontinue taking the Protonix.  Please do not forget your ultrasound appointment.  Please come back to our office in 4 weeks from today to check for H Pylori bacteria.  Lab hours: Monday 1:30-4:30 PM Tuesday 8:30-4:30 PM Wednesday 8:30-4:30 PM Thursday 1:30-4:30 PM  Please start taking Linzess 145 mcg one table daily.

## 2020-04-30 NOTE — Telephone Encounter (Signed)
Called patient back but had to leave her a voicemail letting her know that Dr. Maximino Greenland would like to see her in the office to discuss further. Dr. Maximino Greenland will be able to see her today in the afternoon, hopefully she calls back to agree to be seen today at 2:15 PM.

## 2020-04-30 NOTE — Telephone Encounter (Signed)
Patient says she spoke with provider concerning the severe pain she was in. Says she needs pain medicine asap

## 2020-05-05 ENCOUNTER — Telehealth: Payer: Self-pay | Admitting: Gastroenterology

## 2020-05-05 NOTE — Telephone Encounter (Signed)
Wants a call from the doctor regarding the pain.

## 2020-05-05 NOTE — Telephone Encounter (Signed)
Patient Gina Nguyen and asks for call back to possibly move her [place] of Abdomen to a sooner date.  Patient states she is still having pain

## 2020-05-06 ENCOUNTER — Telehealth (INDEPENDENT_AMBULATORY_CARE_PROVIDER_SITE_OTHER): Payer: 59 | Admitting: Gastroenterology

## 2020-05-06 ENCOUNTER — Encounter: Payer: Self-pay | Admitting: Gastroenterology

## 2020-05-06 ENCOUNTER — Other Ambulatory Visit: Payer: Self-pay

## 2020-05-06 DIAGNOSIS — Z8619 Personal history of other infectious and parasitic diseases: Secondary | ICD-10-CM | POA: Diagnosis not present

## 2020-05-06 DIAGNOSIS — R1013 Epigastric pain: Secondary | ICD-10-CM | POA: Diagnosis not present

## 2020-05-06 MED ORDER — PANTOPRAZOLE SODIUM 40 MG PO TBEC: DELAYED_RELEASE_TABLET | ORAL | 0 refills | Status: DC

## 2020-05-06 NOTE — Telephone Encounter (Signed)
I asked Gina Nguyen to call the patient and offer her a MyChart visit so Dr. Maximino Greenland could speak to the patient.

## 2020-05-06 NOTE — Progress Notes (Signed)
Gina Bouillon, MD [address]  [address]  [place], [place] [address]  Main: [phone]  Fax: [phone]   Primary Care Physician: Lauro Regulus, MD  Virtual Visit via Video Note  I connected with patient on 05/06/20 at  2:00 PM EDT by video and verified that I am speaking with the correct person using two identifiers.   I discussed the limitations, risks, security and privacy concerns of performing an evaluation and management service by video and the availability of in person appointments. I also discussed with the patient that there may be a patient responsible charge related to this service. The patient expressed understanding and agreed to proceed.  Location of Patient: Home Location of Provider: Home Persons involved: Patient and provider only (Nursing staff checked in patient via phone but were not physically involved in the video interaction - see their notes)   History of Present Illness: Chief complaint: Abdominal pain  HPI: Gina Nguyen is a 65 y.o. female here for follow-up of abdominal pain.  Patient completed treatment for H. pylori based on IgM positive antibody.  PPI was discontinued on last visit in anticipation of H. pylori breath test in 4 weeks.  However, patient reports abdominal pain without the medication.  She was also having constipation Linzess was started on last visit, with which she is having a bowel movement every day or every other day, compared to much less frequent before.  No nausea or vomiting, no weight loss.  No blood in the stool.  Current Outpatient Medications  Medication Sig Dispense Refill  . amLODipine (NORVASC) 5 MG tablet Take 5 mg by mouth daily.    . clopidogrel (PLAVIX) 75 MG tablet Take by mouth.    . Cyanocobalamin 1000 MCG TBCR Take 1,000 mcg by mouth daily.    Marland Kitchen linaclotide (LINZESS) 145 MCG CAPS capsule Take 1 capsule (145 mcg total) by mouth daily. 90 capsule 0  . lisinopril (PRINIVIL,ZESTRIL) 20 MG tablet  Take 20 mg by mouth daily.    Marland Kitchen neomycin-polymyxin b-dexamethasone (MAXITROL) 3.5-10000-0.1 SUSP Place 1 drop into both eyes in the morning and at bedtime.    . nicotine (NICODERM CQ - DOSED IN MG/24 HR) 7 mg/24hr patch 1 patch chest wall daily (okay use substitute) 28 patch 0  . nortriptyline (PAMELOR) 10 MG capsule Take 1 capsule nightly for one week, then increase to 2 capsules nightly.    . pantoprazole (PROTONIX) 40 MG tablet Take 1 tablet (40 mg total) by mouth 2 (two) times daily for 30 days, THEN 1 tablet (40 mg total) daily. 90 tablet 0  . pravastatin (PRAVACHOL) 40 MG tablet Take 40 mg by mouth at bedtime.    . propranolol (INDERAL) 40 MG tablet Take 1 tablet by mouth 2 (two) times daily.    Marland Kitchen pyridoxine (B-6) 100 MG tablet Take 100 mg by mouth daily.    . SUMAtriptan (IMITREX) 100 MG tablet Take by mouth.    . torsemide (DEMADEX) 10 MG tablet Take 10 mg by mouth daily.    . traZODone (DESYREL) 50 MG tablet Take 50-100 mg by mouth at bedtime as needed for sleep.     No current facility-administered medications for this visit.    Allergies as of 05/06/2020 - Review Complete 05/06/2020  Allergen Reaction Noted  . Influenza vaccines  10/26/2018  . Crab extract allergy skin test  01/15/2020  . Eggs or egg-derived products Swelling 11/29/2016  . Shrimp (diagnostic)  01/15/2020    Review  of Systems:    All systems reviewed and negative except where noted in HPI.   Observations/Objective:  Labs: CMP     Component Value Date/Time   NA 133 (L) 02/07/2020 0623   K 3.8 02/07/2020 0623   CL 98 02/07/2020 0623   CO2 25 02/07/2020 0623   GLUCOSE 91 02/07/2020 0623   BUN 7 (L) 02/07/2020 0623   BUN 15 10/17/2012 0805   CREATININE 1.00 02/07/2020 0623   CREATININE 1.16 10/17/2012 0805   CALCIUM 9.5 02/07/2020 0623   GFRNONAA >60 02/07/2020 0623   GFRNONAA 51 (L) 10/17/2012 0805   GFRAA 60 (L) 10/17/2012 0805   Lab Results  Component Value Date   WBC 6.5 02/28/2020   HGB  12.6 02/28/2020   HCT 39.8 02/28/2020   MCV 77.7 (L) 02/28/2020   PLT 512 (H) 02/28/2020    Imaging Studies: No results found.  Assessment and Plan:   Gina Nguyen is a 65 y.o. y/o female here for follow-up of abdominal pain  Assessment and Plan: Patient's abdominal pain is likely occurring due to stopping her chronic PPI. Treatment of H pylori can worsen reflux and symptoms may be due to reflux as well  Will restart PPI and cancel her H. pylori breath test as eradication testing can be done at a later time  (Risks of PPI use were discussed with patient including bone loss, C. Diff diarrhea, pneumonia, infections, CKD, electrolyte abnormalities.  Pt. Verbalizes understanding and chooses to continue the medication.)  If symptoms do not get better within 1 to 2 weeks, patient advised to call us back  No alarm symptoms present to indicate endoscopy at this time  Right upper quadrant ultrasound scheduled for next week  Follow Up Instructions: 6 to 8 weeks   I discussed the assessment and treatment plan with the patient. The patient was provided an opportunity to ask questions and all were answered. The patient agreed with the plan and demonstrated an understanding of the instructions.   The patient was advised to call back or seek an in-person evaluation if the symptoms worsen or if the condition fails to improve as anticipated.  I provided 15 minutes of face-to-face time via video software during this encounter. Additional time was spent in reviewing patient's chart, placing orders etc.   Pasty Spillers, MD  Speech recognition software was used to dictate this note.

## 2020-05-12 ENCOUNTER — Other Ambulatory Visit: Payer: Self-pay

## 2020-05-12 ENCOUNTER — Ambulatory Visit
Admission: RE | Admit: 2020-05-12 | Discharge: 2020-05-12 | Disposition: A | Discharge status: Home / Self Care | Payer: 59 | Source: Ambulatory Visit | Attending: Gastroenterology | Admitting: Gastroenterology

## 2020-05-12 DIAGNOSIS — R1011 Right upper quadrant pain: Secondary | ICD-10-CM | POA: Diagnosis present

## 2020-05-13 ENCOUNTER — Telehealth: Payer: Self-pay | Admitting: Gastroenterology

## 2020-05-13 NOTE — Telephone Encounter (Signed)
Patient called again, second time today, to ask for call back regarding results. Patient states she has been in pain since April 11, 2020 and needs to know what she can take.  Please call to advise

## 2020-05-13 NOTE — Telephone Encounter (Signed)
Patient called Gina Nguyen to call back to explain her results from her ultrasound

## 2020-05-20 NOTE — Telephone Encounter (Signed)
Spoke with patient see other phone note. Patient scheduled to see Dr.Tahiliani on 06/18/2020 instead 06/30 to discuss results and plan of care

## 2020-05-20 NOTE — Telephone Encounter (Signed)
Spoke with patient. Patient reports all she was told was her [place] was normal but she is still having pain and would like to discuss with Dr. Maximino Greenland. Patient appointement moved from 6/30 to 6/15.

## 2020-05-26 ENCOUNTER — Other Ambulatory Visit: Payer: 59

## 2020-05-27 ENCOUNTER — Ambulatory Visit: Payer: 59 | Admitting: Oncology

## 2020-05-27 ENCOUNTER — Other Ambulatory Visit: Payer: Self-pay

## 2020-05-27 ENCOUNTER — Ambulatory Visit: Payer: 59

## 2020-05-27 MED ORDER — PANTOPRAZOLE SODIUM 40 MG PO TBEC: DELAYED_RELEASE_TABLET | ORAL | 0 refills | Status: DC

## 2020-06-03 ENCOUNTER — Ambulatory Visit: Payer: 59

## 2020-06-03 ENCOUNTER — Ambulatory Visit: Payer: 59 | Admitting: Oncology

## 2020-06-06 NOTE — Progress Notes (Signed)
[facility]  [phone] [phone]  ID: Gina Nguyen OB: Dec 13, 1955  MR#: [id number]  EXN#:[id number]  Patient Care Team: Lauro Regulus, MD as PCP - General (Internal Medicine)  CHIEF COMPLAINT: Symptomatic anemia.  INTERVAL HISTORY: Patient returns to clinic today for repeat laboratory work and further evaluation.  She continues to feel well and remains asymptomatic.  She denies any weakness or fatigue. She has no neurologic complaints.  She denies any recent fevers or illnesses.  She has a good appetite and denies weight loss.  She has no chest pain, shortness of breath, cough, or hemoptysis.  She denies any nausea, vomiting, constipation, or diarrhea.  She has no melena or hematochezia.  She has no urinary complaints.  Patient offers no specific complaints today.  REVIEW OF SYSTEMS:   Review of Systems  Constitutional: Negative.  Negative for fever, malaise/fatigue and weight loss.  Respiratory: Negative.  Negative for cough, hemoptysis and shortness of breath.   Cardiovascular: Negative.  Negative for chest pain and leg swelling.  Gastrointestinal: Negative.  Negative for abdominal pain, blood in stool and melena.  Genitourinary: Negative.  Negative for hematuria.  Musculoskeletal: Negative.  Negative for back pain.  Skin: Negative.  Negative for rash.  Neurological: Negative.  Negative for dizziness, focal weakness, weakness and headaches.  Psychiatric/Behavioral: Negative.  The patient is not nervous/anxious.     As per HPI. Otherwise, a complete review of systems is negative.  PAST MEDICAL HISTORY: Past Medical History:  Diagnosis Date  . Allergy   . Arthritis   . Hypercholesterolemia   . Hypertension   . Peripheral vascular disease (HCC)     PAST SURGICAL HISTORY: Past Surgical History:  Procedure Laterality Date  . ABDOMINAL HYSTERECTOMY    . BREAST CYST ASPIRATION Right   . COLONOSCOPY N/A 02/09/2020   Procedure:  COLONOSCOPY;  Surgeon: Pasty Spillers, MD;  Location: [facility] ENDOSCOPY;  Service: Gastroenterology;  Laterality: N/A;  . ESOPHAGOGASTRODUODENOSCOPY N/A 02/09/2020   Procedure: ESOPHAGOGASTRODUODENOSCOPY (EGD);  Surgeon: Pasty Spillers, MD;  Location: [facility] ENDOSCOPY;  Service: Gastroenterology;  Laterality: N/A;  . LOWER EXTREMITY ANGIOGRAPHY Left 01/15/2020   Procedure: LOWER EXTREMITY ANGIOGRAPHY;  Surgeon: Renford Dills, MD;  Location: [facility] INVASIVE CV LAB;  Service: Cardiovascular;  Laterality: Left;    FAMILY HISTORY: Family History  Problem Relation Age of Onset  . Breast cancer Mother 58    ADVANCED DIRECTIVES (Y/N):  N  HEALTH MAINTENANCE: Social History   Tobacco Use  . Smoking status: Former Smoker    Types: Cigarettes  . Smokeless tobacco: Former Neurosurgeon    Types: Snuff, Chew  Substance Use Topics  . Alcohol use: No  . Drug use: No     Colonoscopy:  PAP:  Bone density:  Lipid panel:  Allergies  Allergen Reactions  . Influenza Vaccines     Other reaction(s): Other (See Comments) Allergy to eggs  . Crab Extract Allergy Skin Test     Vomiting   . Eggs Or Egg-Derived Products Swelling    Vomiting, swelling of stomach per patient   . Shrimp (Diagnostic)     vomiting    Current Outpatient Medications  Medication Sig Dispense Refill  . amLODipine (NORVASC) 5 MG tablet Take 5 mg by mouth daily.    . clopidogrel (PLAVIX) 75 MG tablet Take by mouth.    . Cyanocobalamin 1000 MCG TBCR Take 1,000 mcg by mouth daily.    Marland Kitchen linaclotide (LINZESS) 145 MCG CAPS capsule Take 1  capsule (145 mcg total) by mouth daily. 90 capsule 0  . lisinopril (PRINIVIL,ZESTRIL) 20 MG tablet Take 20 mg by mouth daily.    Marland Kitchen neomycin-polymyxin b-dexamethasone (MAXITROL) 3.5-10000-0.1 SUSP Place 1 drop into both eyes in the morning and at bedtime.    . nicotine (NICODERM CQ - DOSED IN MG/24 HR) 7 mg/24hr patch 1 patch chest wall daily (okay use substitute) 28 patch 0  .  nortriptyline (PAMELOR) 10 MG capsule Take 1 capsule nightly for one week, then increase to 2 capsules nightly.    . pantoprazole (PROTONIX) 40 MG tablet Take 1 tablet (40 mg total) by mouth 2 (two) times daily for 30 days, THEN 1 tablet (40 mg total) daily. 90 tablet 0  . pravastatin (PRAVACHOL) 40 MG tablet Take 40 mg by mouth at bedtime.    . propranolol (INDERAL) 40 MG tablet Take 1 tablet by mouth 2 (two) times daily.    Marland Kitchen pyridoxine (B-6) 100 MG tablet Take 100 mg by mouth daily.    . SUMAtriptan (IMITREX) 100 MG tablet Take by mouth.    . torsemide (DEMADEX) 10 MG tablet Take 10 mg by mouth daily.    . traZODone (DESYREL) 50 MG tablet Take 50-100 mg by mouth at bedtime as needed for sleep.     No current facility-administered medications for this visit.    OBJECTIVE: Vitals:   06/10/20 1311  BP: 130/86  Pulse: 86  Resp: 16  Temp: 97.9 F (36.6 C)     Body mass index is 20.78 kg/m.    ECOG FS:0 - Asymptomatic  General: Well-developed, well-nourished, no acute distress. Eyes: Pink conjunctiva, anicteric sclera. HEENT: Normocephalic, moist mucous membranes. Lungs: No audible wheezing or coughing. Heart: Regular rate and rhythm. Abdomen: Soft, nontender, no obvious distention. Musculoskeletal: No edema, cyanosis, or clubbing. Neuro: Alert, answering all questions appropriately. Cranial nerves grossly intact. Skin: No rashes or petechiae noted. Psych: Normal affect.  LAB RESULTS:  Lab Results  Component Value Date   NA 133 (L) 02/07/2020   K 3.8 02/07/2020   CL 98 02/07/2020   CO2 25 02/07/2020   GLUCOSE 91 02/07/2020   BUN 7 (L) 02/07/2020   CREATININE 1.00 02/07/2020   CALCIUM 9.5 02/07/2020   GFRNONAA >60 02/07/2020   GFRAA 60 (L) 10/17/2012    Lab Results  Component Value Date   WBC 6.2 06/09/2020   NEUTROABS 3.3 06/09/2020   HGB 12.3 06/09/2020   HCT 35.2 (L) 06/09/2020   MCV 81.7 06/09/2020   PLT 359 06/09/2020   Lab Results  Component Value Date    IRON 75 06/09/2020   TIBC 335 06/09/2020   IRONPCTSAT 22 06/09/2020   Lab Results  Component Value Date   FERRITIN 24 06/09/2020     STUDIES: US ABDOMEN LIMITED RUQ (LIVER/GB)  Result Date: 05/12/2020 CLINICAL DATA:  One month of right upper quadrant abdominal pain. EXAM: ULTRASOUND ABDOMEN LIMITED RIGHT UPPER QUADRANT COMPARISON:  CT December 14, 2012 FINDINGS: Gallbladder: No gallstones or wall thickening visualized. No sonographic Murphy sign noted by sonographer. Common bile duct: Diameter: 3 mm Liver: No focal solid lesion identified. 6 mm cyst in the left lobe of the liver. Within normal limits in parenchymal echogenicity. Portal vein is patent on color Doppler imaging with normal direction of blood flow towards the liver. Other: None. IMPRESSION: Unremarkable right upper quadrant ultrasound. Electronically Signed   By: Maudry Mayhew MD   On: 05/12/2020 14:51    ASSESSMENT: Symptomatic anemia.  PLAN:  1. Symptomatic anemia: Resolved.  Patient had a significant GI bleed with hemoglobin decreasing to less than 4.0.  Despite that, patient denies ever having melena or hematochezia.  Colonoscopy and EGD completed on February 09, 2020 only revealed 2 angiectasia's in the colon and duodenum that were cauterized with argon plasma coagulation.  Her hemoglobin and iron stores continue to be within normal limits and patient is asymptomatic.  No intervention is needed at this time.  Please refer back if patient's hemoglobin begins to decline or she becomes symptomatic.  No follow-up has been scheduled.   I spent a total of 20 minutes reviewing chart data, face-to-face evaluation with the patient, counseling and coordination of care as detailed above.    Patient expressed understanding and was in agreement with this plan. She also understands that She can call clinic at any time with any questions, concerns, or complaints.    Gina Ruths, MD   06/11/2020 9:49 AM

## 2020-06-09 ENCOUNTER — Other Ambulatory Visit: Payer: Self-pay

## 2020-06-09 ENCOUNTER — Inpatient Hospital Stay: Payer: 59 | Attending: Oncology

## 2020-06-09 ENCOUNTER — Other Ambulatory Visit: Payer: Self-pay | Admitting: *Deleted

## 2020-06-09 DIAGNOSIS — D649 Anemia, unspecified: Secondary | ICD-10-CM | POA: Diagnosis not present

## 2020-06-09 DIAGNOSIS — Z87891 Personal history of nicotine dependence: Secondary | ICD-10-CM | POA: Diagnosis not present

## 2020-06-09 LAB — IRON AND TIBC
Iron: 75 ug/dL (ref 28–170)
Saturation Ratios: 22 % (ref 10.4–31.8)
TIBC: 335 ug/dL (ref 250–450)
UIBC: 260 ug/dL

## 2020-06-09 LAB — CBC WITH DIFFERENTIAL/PLATELET
Abs Immature Granulocytes: 0.02 10*3/uL (ref 0.00–0.07)
Basophils Absolute: 0 10*3/uL (ref 0.0–0.1)
Basophils Relative: 0 %
Eosinophils Absolute: 0.2 10*3/uL (ref 0.0–0.5)
Eosinophils Relative: 3 %
HCT: 35.2 % — ABNORMAL LOW (ref 36.0–46.0)
Hemoglobin: 12.3 g/dL (ref 12.0–15.0)
Immature Granulocytes: 0 %
Lymphocytes Relative: 36 %
Lymphs Abs: 2.2 10*3/uL (ref 0.7–4.0)
MCH: 28.5 pg (ref 26.0–34.0)
MCHC: 34.9 g/dL (ref 30.0–36.0)
MCV: 81.7 fL (ref 80.0–100.0)
Monocytes Absolute: 0.5 10*3/uL (ref 0.1–1.0)
Monocytes Relative: 7 %
Neutro Abs: 3.3 10*3/uL (ref 1.7–7.7)
Neutrophils Relative %: 54 %
Platelets: 359 10*3/uL (ref 150–400)
RBC: 4.31 MIL/uL (ref 3.87–5.11)
RDW: 14.2 % (ref 11.5–15.5)
WBC: 6.2 10*3/uL (ref 4.0–10.5)
nRBC: 0 % (ref 0.0–0.2)

## 2020-06-09 LAB — FERRITIN: Ferritin: 24 ng/mL (ref 11–307)

## 2020-06-10 ENCOUNTER — Inpatient Hospital Stay: Payer: 59

## 2020-06-10 ENCOUNTER — Encounter: Payer: Self-pay | Admitting: Oncology

## 2020-06-10 ENCOUNTER — Inpatient Hospital Stay (HOSPITAL_BASED_OUTPATIENT_CLINIC_OR_DEPARTMENT_OTHER): Payer: 59 | Admitting: Oncology

## 2020-06-10 VITALS — BP 130/86 | HR 86 | Temp 97.9°F | Resp 16 | Wt 124.9 lb

## 2020-06-10 DIAGNOSIS — D649 Anemia, unspecified: Secondary | ICD-10-CM

## 2020-06-10 NOTE — Progress Notes (Signed)
Patient here for follow up. She is being treated for a broken tooth. No concerns today.

## 2020-06-11 ENCOUNTER — Encounter: Payer: Self-pay | Admitting: Oncology

## 2020-06-18 ENCOUNTER — Encounter: Payer: Self-pay | Admitting: Gastroenterology

## 2020-06-18 ENCOUNTER — Ambulatory Visit (INDEPENDENT_AMBULATORY_CARE_PROVIDER_SITE_OTHER): Payer: 59 | Admitting: Gastroenterology

## 2020-06-18 ENCOUNTER — Other Ambulatory Visit: Payer: Self-pay

## 2020-06-18 VITALS — BP 136/76 | HR 85 | Temp 98.4°F | Ht 65.0 in | Wt 123.6 lb

## 2020-06-18 DIAGNOSIS — Z8619 Personal history of other infectious and parasitic diseases: Secondary | ICD-10-CM | POA: Diagnosis not present

## 2020-06-18 DIAGNOSIS — K219 Gastro-esophageal reflux disease without esophagitis: Secondary | ICD-10-CM | POA: Diagnosis not present

## 2020-06-18 NOTE — Progress Notes (Signed)
[name], MD [address]  [address]  [place], [place] [address]  Main: [phone]  Fax: [phone]   Primary Care Physician: Lauro Regulus, MD   Chief Complaint  Patient presents with   Follow-up    Abdominal pain - no pain     HPI: Gina Nguyen is a 65 y.o. female here for follow-up of abdominal pain.  Patient doing well on twice daily PPI.  Abdominal pain has completely resolved with this.  No nausea or vomiting.  Previous history of H. pylori that was treated.  Patient states she did try to decrease it to once a day about a week ago but had severe reflux symptoms and went back up to twice a day   ROS: All ROS reviewed a negative except as per HPI   Past Medical History:  Diagnosis Date   Allergy    Arthritis    Hypercholesterolemia    Hypertension    Peripheral vascular disease (HCC)     Past Surgical History:  Procedure Laterality Date   ABDOMINAL HYSTERECTOMY     BREAST CYST ASPIRATION Right    COLONOSCOPY N/A 02/09/2020   Procedure: COLONOSCOPY;  Surgeon: Pasty Spillers, MD;  Location: [facility] ENDOSCOPY;  Service: Gastroenterology;  Laterality: N/A;   ESOPHAGOGASTRODUODENOSCOPY N/A 02/09/2020   Procedure: ESOPHAGOGASTRODUODENOSCOPY (EGD);  Surgeon: Pasty Spillers, MD;  Location: [facility] ENDOSCOPY;  Service: Gastroenterology;  Laterality: N/A;   LOWER EXTREMITY ANGIOGRAPHY Left 01/15/2020   Procedure: LOWER EXTREMITY ANGIOGRAPHY;  Surgeon: Renford Dills, MD;  Location: [facility] INVASIVE CV LAB;  Service: Cardiovascular;  Laterality: Left;    Prior to Admission medications   Medication Sig Start Date End Date Taking? Authorizing Provider  amLODipine (NORVASC) 5 MG tablet Take 5 mg by mouth daily. 06/16/16  Yes [provider]  clopidogrel (PLAVIX) 75 MG tablet Take by mouth. 02/13/20  Yes [provider]  Cyanocobalamin 1000 MCG TBCR Take 1,000 mcg by mouth daily.   Yes [provider]  linaclotide (LINZESS)  145 MCG CAPS capsule Take 1 capsule (145 mcg total) by mouth daily. 04/30/20  Yes [name] B, MD  lisinopril (PRINIVIL,ZESTRIL) 20 MG tablet Take 20 mg by mouth daily. 01/23/16  Yes [provider]  neomycin-polymyxin b-dexamethasone (MAXITROL) 3.5-10000-0.1 SUSP Place 1 drop into both eyes in the morning and at bedtime. 10/19/19  Yes [provider]  nicotine (NICODERM CQ - DOSED IN MG/24 HR) 7 mg/24hr patch 1 patch chest wall daily (okay use substitute) 02/09/20  Yes Wieting, Richard, MD  nortriptyline (PAMELOR) 10 MG capsule Take 1 capsule nightly for one week, then increase to 2 capsules nightly. 03/24/20  Yes [provider]  pantoprazole (PROTONIX) 40 MG tablet Take 1 tablet (40 mg total) by mouth 2 (two) times daily for 30 days, THEN 1 tablet (40 mg total) daily. 05/27/20 07/26/20 Yes Pasty Spillers, MD  pravastatin (PRAVACHOL) 40 MG tablet Take 40 mg by mouth at bedtime. 05/17/16  Yes [provider]  propranolol (INDERAL) 40 MG tablet Take 1 tablet by mouth 2 (two) times daily. 04/26/19  Yes [provider]  pyridoxine (B-6) 100 MG tablet Take 100 mg by mouth daily.   Yes [provider]  torsemide (DEMADEX) 10 MG tablet Take 10 mg by mouth daily. 08/23/16  Yes [provider]  traZODone (DESYREL) 50 MG tablet Take 50-100 mg by mouth at bedtime as needed for sleep. 01/31/20  Yes [provider]  SUMAtriptan (IMITREX) 100  MG tablet Take by mouth. Patient not taking: Reported on 06/18/2020 02/20/20   [provider]    Family History  Problem Relation Age of Onset   Breast cancer Mother 21     Social History   Tobacco Use   Smoking status: Former    Pack years: 0.00    Types: Cigarettes   Smokeless tobacco: Former    Types: Snuff, Chew  Substance Use Topics   Alcohol use: No   Drug use: No    Allergies as of 06/18/2020 - Review Complete 06/18/2020  Allergen Reaction Noted   Influenza vaccines   10/26/2018   Crab extract allergy skin test  01/15/2020   Eggs or egg-derived products Swelling 11/29/2016   Shrimp (diagnostic)  01/15/2020    Physical Examination:   BP 136/76   Pulse 85   Temp 98.4 F (36.9 C) (Oral)   Ht 5\' 5"  (1.651 m)   Wt 123 lb 9.6 oz (56.1 kg)   BMI 20.57 kg/m   Constitutional: General:   Alert,  Well-developed, well-nourished, pleasant and cooperative in NAD BP 136/76   Pulse 85   Temp 98.4 F (36.9 C) (Oral)   Ht 5\' 5"  (1.651 m)   Wt 123 lb 9.6 oz (56.1 kg)   BMI 20.57 kg/m   Eyes:  Sclera clear, no icterus.   Conjunctiva pink. PERRLA  Ears:  No scars, lesions or masses, Normal auditory acuity. Nose:  No deformity, discharge, or lesions. Mouth:  No deformity or lesions, oropharynx pink & moist.  Neck:  Supple; no masses or thyromegaly.  Respiratory: Normal respiratory effort, Normal percussion  Gastrointestinal:  Normal bowel sounds.  No bruits.  Soft, non-tender and non-distended without masses, hepatosplenomegaly or hernias noted.  No guarding or rebound tenderness.     Cardiac: No clubbing or edema.  No cyanosis. Normal posterior tibial pedal pulses noted.  Lymphatic:  No significant cervical or axillary adenopathy.  Psych:  Alert and cooperative. Normal mood and affect.  Musculoskeletal:  Normal gait. Head normocephalic, atraumatic. Symmetrical without gross deformities. 5/5 Upper and Lower extremity strength bilaterally.  Skin: Warm. Intact without significant lesions or rashes. No jaundice.  Neurologic:  Face symmetrical, tongue midline, Normal sensation to touch;  grossly normal neurologically.  Psych:  Alert and oriented x3, Alert and cooperative. Normal mood and affect.  Labs: CMP     Component Value Date/Time   NA 133 (L) 02/07/2020 0623   K 3.8 02/07/2020 0623   CL 98 02/07/2020 0623   CO2 25 02/07/2020 0623   GLUCOSE 91 02/07/2020 0623   BUN 7 (L) 02/07/2020 0623   BUN 15 10/17/2012 0805   CREATININE 1.00  02/07/2020 0623   CREATININE 1.16 10/17/2012 0805   CALCIUM 9.5 02/07/2020 0623   GFRNONAA >60 02/07/2020 0623   GFRNONAA 51 (L) 10/17/2012 0805   GFRAA 60 (L) 10/17/2012 0805   Lab Results  Component Value Date   WBC 6.2 06/09/2020   HGB 12.3 06/09/2020   HCT 35.2 (L) 06/09/2020   MCV 81.7 06/09/2020   PLT 359 06/09/2020    Imaging Studies:    Assessment and Plan:   Gina Nguyen is a 65 y.o. y/o female here for follow-up of epigastric pain and GERD  Symptoms doing well on twice daily PPI We did discuss adverse effects of PPI and therefore have encouraged her to decrease it to once a day on 06/25/2020 as detailed on her prescription.  She is willing to try this.  If  symptoms return during decrease to once daily therapy, H2 RA can be considered in combination with her PPI  H. pylori eradication testing still needs to be done and had to be canceled last time as patient was unable to come off PPI.  On next visit, we may need to do H. pylori serology versus breath test if patient able to discontinue PPI therapy by then  (Risks of PPI use were discussed with patient including bone loss, C. Diff diarrhea, pneumonia, infections, CKD, electrolyte abnormalities.  Pt. Verbalizes understanding and chooses to continue the medication.)      Dr [name]

## 2020-07-03 ENCOUNTER — Ambulatory Visit: Payer: 59 | Admitting: Gastroenterology

## 2020-07-08 ENCOUNTER — Other Ambulatory Visit: Payer: Self-pay | Admitting: Gastroenterology

## 2020-07-08 DIAGNOSIS — Z8619 Personal history of other infectious and parasitic diseases: Secondary | ICD-10-CM

## 2020-07-08 MED ORDER — FAMOTIDINE 20 MG PO TABS: 20.0000 mg | ORAL_TABLET | Freq: Every evening | ORAL | 0 refills | Status: DC

## 2020-07-10 LAB — H PYLORI, IGM, IGG, IGA AB
H pylori, IgM Abs: <9 units (ref 0.0–8.9)
H. pylori, IgA Abs: <9 units (ref 0.0–8.9)
H. pylori, IgG AbS: 0.23 Index Value (ref 0.00–0.79)

## 2020-07-10 MED ORDER — BISMUTH SUBSALICYLATE 262 MG PO CHEW: 524.0000 mg | CHEWABLE_TABLET | Freq: Three times a day (TID) | ORAL | 0 refills | Status: AC

## 2020-07-10 MED ORDER — PANTOPRAZOLE SODIUM 20 MG PO TBEC: 20.0000 mg | DELAYED_RELEASE_TABLET | Freq: Every day | ORAL | 0 refills | Status: DC

## 2020-07-10 MED ORDER — DOXYCYCLINE HYCLATE 100 MG PO CAPS: 100.0000 mg | ORAL_CAPSULE | Freq: Two times a day (BID) | ORAL | 0 refills | Status: AC

## 2020-07-10 MED ORDER — METRONIDAZOLE 250 MG PO TABS: 250.0000 mg | ORAL_TABLET | Freq: Four times a day (QID) | ORAL | 0 refills | Status: AC

## 2020-07-10 NOTE — Addendum Note (Signed)
Addended by: [name] on: 07/10/2020 11:33 AM   Modules accepted: Orders

## 2020-07-15 ENCOUNTER — Other Ambulatory Visit: Payer: Self-pay | Admitting: Internal Medicine

## 2020-07-15 DIAGNOSIS — Z1231 Encounter for screening mammogram for malignant neoplasm of breast: Secondary | ICD-10-CM

## 2020-08-19 NOTE — Progress Notes (Signed)
MRN : [id number]  Gina Nguyen is a 65 y.o. (07-Dec-1955) female who presents with chief complaint of problems walking.  History of Present Illness:   The patient returns to the office for followup and review status post angiogram, no intervention was performed.  The primary lesion identified was at the common femoral bifurcation extending into the origin of the SFA and profunda.   The patient notes improvement in the lower extremity symptoms, she states she has been walking better than ever. No interval shortening of the patient's claudication distance or rest pain symptoms. Previous wounds have now healed.  No new ulcers or wounds have occurred since the last visit.     left lower extremity distal runoff third order catheter placement was performed 01/15/2019.   There have been no significant changes to the patient's overall health care.   The patient denies amaurosis fugax or recent TIA symptoms. There are no recent neurological changes noted. The patient denies history of DVT, PE or superficial thrombophlebitis. The patient denies recent episodes of angina or shortness of breath.   ABI Rt=1.01 and Lt=0.71  (previous ABI's Rt=0.94 and Lt0.71)  No outpatient medications have been marked as taking for the 08/21/20 encounter (Appointment) with Gilda Crease, Latina Craver, MD.    Past Medical History:  Diagnosis Date   Allergy    Arthritis    Hypercholesterolemia    Hypertension    Peripheral vascular disease [facility])     Past Surgical History:  Procedure Laterality Date   ABDOMINAL HYSTERECTOMY     BREAST CYST ASPIRATION Right    COLONOSCOPY N/A 02/09/2020   Procedure: COLONOSCOPY;  Surgeon: Pasty Spillers, MD;  Location: [facility] ENDOSCOPY;  Service: Gastroenterology;  Laterality: N/A;   ESOPHAGOGASTRODUODENOSCOPY N/A 02/09/2020   Procedure: ESOPHAGOGASTRODUODENOSCOPY (EGD);  Surgeon: Pasty Spillers, MD;  Location: [facility] ENDOSCOPY;  Service: Gastroenterology;  Laterality: N/A;    LOWER EXTREMITY ANGIOGRAPHY Left 01/15/2020   Procedure: LOWER EXTREMITY ANGIOGRAPHY;  Surgeon: Renford Dills, MD;  Location: [facility] INVASIVE CV LAB;  Service: Cardiovascular;  Laterality: Left;    Social History Social History   Tobacco Use   Smoking status: Former    Types: Cigarettes   Smokeless tobacco: Former    Types: Snuff, Chew  Substance Use Topics   Alcohol use: No   Drug use: No    Family History Family History  Problem Relation Age of Onset   Breast cancer Mother 31    Allergies  Allergen Reactions   Influenza Vaccines     Other reaction(s): Other (See Comments) Allergy to eggs   Crab Extract Allergy Skin Test     Vomiting    Eggs Or Egg-Derived Products Swelling    Vomiting, swelling of stomach per patient    Shrimp (Diagnostic)     vomiting     REVIEW OF SYSTEMS (Negative unless checked)  Constitutional: [] Weight loss  [] Fever  [] Chills Cardiac: [] Chest pain   [] Chest pressure   [] Palpitations   [] Shortness of breath when laying flat   [] Shortness of breath with exertion. Vascular:  [x] Pain in legs with walking   [] Pain in legs at rest  [] History of DVT   [] Phlebitis   [] Swelling in legs   [] Varicose veins   [] Non-healing ulcers Pulmonary:   [] Uses home oxygen   [] Productive cough   [] Hemoptysis   [] Wheeze  [] COPD   [] Asthma Neurologic:  [] Dizziness   [] Seizures   [] History of stroke   [] History of TIA  [] Aphasia   [] Vissual  changes   [] Weakness or numbness in arm   [] Weakness or numbness in leg Musculoskeletal:   [] Joint swelling   [] Joint pain   [] Low back pain Hematologic:  [] Easy bruising  [] Easy bleeding   [] Hypercoagulable state   [] Anemic Gastrointestinal:  [] Diarrhea   [] Vomiting  [] Gastroesophageal reflux/heartburn   [] Difficulty swallowing. Genitourinary:  [] Chronic kidney disease   [] Difficult urination  [] Frequent urination   [] Blood in urine Skin:  [] Rashes   [] Ulcers  Psychological:  [] History of anxiety   []  History of major  depression.  Physical Examination  There were no vitals filed for this visit. There is no height or weight on file to calculate BMI. Gen: WD/WN, NAD Head: [place]/AT, No temporalis wasting.  Ear/Nose/Throat: Hearing grossly intact, nares w/o erythema or drainage Eyes: PER, EOMI, sclera nonicteric.  Neck: Supple, no masses.  No bruit or JVD.  Pulmonary:  Good air movement, no audible wheezing, no use of accessory muscles.  Cardiac: RRR, normal S1, S2, no Murmurs. Vascular:   Vessel Right Left  Radial Palpable Palpable  Carotid Palpable Palpable  PT Trace Palpable Not Palpable  DP Not Palpable Not Palpable  Gastrointestinal: soft, non-distended. No guarding/no peritoneal signs.  Musculoskeletal: M/S 5/5 throughout.  No visible deformity.  Neurologic: CN 2-12 intact. Pain and light touch intact in extremities.  Symmetrical.  Speech is fluent. Motor exam as listed above. Psychiatric: Judgment intact, Mood & affect appropriate for pt's clinical situation. Dermatologic: No rashes or ulcers noted.  No changes consistent with cellulitis.   CBC Lab Results  Component Value Date   WBC 6.2 06/09/2020   HGB 12.3 06/09/2020   HCT 35.2 (L) 06/09/2020   MCV 81.7 06/09/2020   PLT 359 06/09/2020    BMET    Component Value Date/Time   NA 133 (L) 02/07/2020 0623   K 3.8 02/07/2020 0623   CL 98 02/07/2020 0623   CO2 25 02/07/2020 0623   GLUCOSE 91 02/07/2020 0623   BUN 7 (L) 02/07/2020 0623   BUN 15 10/17/2012 0805   CREATININE 1.00 02/07/2020 0623   CREATININE 1.16 10/17/2012 0805   CALCIUM 9.5 02/07/2020 0623   GFRNONAA >60 02/07/2020 0623   GFRNONAA 51 (L) 10/17/2012 0805   GFRAA 60 (L) 10/17/2012 0805   CrCl cannot be calculated (Patient's most recent lab result is older than the maximum 21 days allowed.).  COAG Lab Results  Component Value Date   INR 1.1 02/05/2020    Radiology No results found.   Assessment/Plan 1. Atherosclerosis of native artery of both lower  extremities with intermittent claudication (HCC)  Recommend:  The patient has evidence of atherosclerosis of the lower extremities with claudication.  The patient does not voice lifestyle limiting changes at this point in time.  Noninvasive studies do not suggest clinically significant change.  No invasive studies, angiography or surgery at this time The patient should continue walking and begin a more formal exercise program.  The patient should continue antiplatelet therapy and aggressive treatment of the lipid abnormalities  No changes in the patient's medications at this time   - VAS ABI WITH/WO TBI; Future - VAS LOWER EXTREMITY ARTERIAL DUPLEX; Future  2. Essential hypertension Continue antihypertensive medications as already ordered, these medications have been reviewed and there are no changes at this time.   3. Lumbar and sacral osteoarthritis Continue NSAID medications as already ordered, these medications have been reviewed and there are no changes at this time.  Continued activity and therapy was stressed.  4. Mixed hyperlipidemia Continue statin as ordered and reviewed, no changes at this time     Levora Dredge, MD  08/19/2020 1:05 PM

## 2020-08-20 ENCOUNTER — Other Ambulatory Visit: Payer: Self-pay | Admitting: Gastroenterology

## 2020-08-21 ENCOUNTER — Ambulatory Visit (INDEPENDENT_AMBULATORY_CARE_PROVIDER_SITE_OTHER): Payer: 59

## 2020-08-21 ENCOUNTER — Ambulatory Visit (INDEPENDENT_AMBULATORY_CARE_PROVIDER_SITE_OTHER): Payer: 59 | Admitting: Vascular Surgery

## 2020-08-21 ENCOUNTER — Other Ambulatory Visit: Payer: Self-pay

## 2020-08-21 ENCOUNTER — Encounter (INDEPENDENT_AMBULATORY_CARE_PROVIDER_SITE_OTHER): Payer: Self-pay | Admitting: Vascular Surgery

## 2020-08-21 VITALS — BP 154/83 | HR 84 | Resp 16 | Wt 120.8 lb

## 2020-08-21 DIAGNOSIS — M47817 Spondylosis without myelopathy or radiculopathy, lumbosacral region: Secondary | ICD-10-CM

## 2020-08-21 DIAGNOSIS — I70213 Atherosclerosis of native arteries of extremities with intermittent claudication, bilateral legs: Secondary | ICD-10-CM | POA: Diagnosis not present

## 2020-08-21 DIAGNOSIS — E782 Mixed hyperlipidemia: Secondary | ICD-10-CM | POA: Diagnosis not present

## 2020-08-21 DIAGNOSIS — I10 Essential (primary) hypertension: Secondary | ICD-10-CM | POA: Diagnosis not present

## 2020-08-24 ENCOUNTER — Encounter (INDEPENDENT_AMBULATORY_CARE_PROVIDER_SITE_OTHER): Payer: Self-pay | Admitting: Vascular Surgery

## 2020-08-29 ENCOUNTER — Other Ambulatory Visit: Payer: Self-pay

## 2020-08-29 ENCOUNTER — Ambulatory Visit
Admission: RE | Admit: 2020-08-29 | Discharge: 2020-08-29 | Disposition: A | Discharge status: Home / Self Care | Payer: 59 | Source: Ambulatory Visit | Attending: Internal Medicine | Admitting: Internal Medicine

## 2020-08-29 DIAGNOSIS — Z1231 Encounter for screening mammogram for malignant neoplasm of breast: Secondary | ICD-10-CM | POA: Diagnosis present

## 2020-09-02 ENCOUNTER — Other Ambulatory Visit: Payer: Self-pay

## 2020-09-02 MED ORDER — LINACLOTIDE 145 MCG PO CAPS: 145.0000 ug | ORAL_CAPSULE | Freq: Every day | ORAL | 0 refills | Status: DC

## 2020-09-18 ENCOUNTER — Other Ambulatory Visit: Payer: Self-pay

## 2020-09-18 ENCOUNTER — Ambulatory Visit: Payer: 59 | Admitting: Gastroenterology

## 2020-09-18 VITALS — BP 159/84 | HR 87 | Temp 99.4°F | Ht 65.0 in | Wt 124.0 lb

## 2020-09-18 DIAGNOSIS — Z8619 Personal history of other infectious and parasitic diseases: Secondary | ICD-10-CM

## 2020-09-18 DIAGNOSIS — K5900 Constipation, unspecified: Secondary | ICD-10-CM | POA: Diagnosis not present

## 2020-09-18 DIAGNOSIS — K219 Gastro-esophageal reflux disease without esophagitis: Secondary | ICD-10-CM

## 2020-09-18 NOTE — Progress Notes (Signed)
[name], MD [address]  [address]  [place], [place] [address]  Main: [phone]  Fax: [phone]   Primary Care Physician: Lauro Regulus, MD   Chief complaint: Abdominal pain  HPI: Gina Nguyen is a 65 y.o. female here for follow-up of abdominal pain.  Patient doing well on current therapy and is taking Linzess and having 1-2 soft bowel movements a day.  No further constipation.  Is also taking PPI which is controlling her reflux symptoms well.  No dysphagia.  No breakthrough symptoms.  No weight loss.  Good appetite.   ROS: All ROS reviewed and negative except as per HPI   Past Medical History:  Diagnosis Date   Allergy    Arthritis    Hypercholesterolemia    Hypertension    Peripheral vascular disease (HCC)     Past Surgical History:  Procedure Laterality Date   ABDOMINAL HYSTERECTOMY     BREAST CYST ASPIRATION Right    COLONOSCOPY N/A 02/09/2020   Procedure: COLONOSCOPY;  Surgeon: Pasty Spillers, MD;  Location: [facility] ENDOSCOPY;  Service: Gastroenterology;  Laterality: N/A;   ESOPHAGOGASTRODUODENOSCOPY N/A 02/09/2020   Procedure: ESOPHAGOGASTRODUODENOSCOPY (EGD);  Surgeon: Pasty Spillers, MD;  Location: [facility] ENDOSCOPY;  Service: Gastroenterology;  Laterality: N/A;   LOWER EXTREMITY ANGIOGRAPHY Left 01/15/2020   Procedure: LOWER EXTREMITY ANGIOGRAPHY;  Surgeon: Renford Dills, MD;  Location: [facility] INVASIVE CV LAB;  Service: Cardiovascular;  Laterality: Left;    Prior to Admission medications   Medication Sig Start Date End Date Taking? Authorizing Provider  amLODipine (NORVASC) 5 MG tablet Take 5 mg by mouth daily. 06/16/16  Yes [provider]  clopidogrel (PLAVIX) 75 MG tablet Take by mouth. 02/13/20  Yes [provider]  Cyanocobalamin 1000 MCG TBCR Take 1,000 mcg by mouth daily.   Yes [provider]  linaclotide (LINZESS) 145 MCG CAPS capsule Take 1 capsule (145 mcg total) by mouth daily. 09/02/20  Yes  [name] B, MD  lisinopril (PRINIVIL,ZESTRIL) 20 MG tablet Take 20 mg by mouth daily. 01/23/16  Yes [provider]  nortriptyline (PAMELOR) 10 MG capsule Take 1 capsule nightly for one week, then increase to 2 capsules nightly. 03/24/20  Yes [provider]  pantoprazole (PROTONIX) 40 MG tablet Take 1 tablet (40 mg total) by mouth daily. 08/20/20  Yes [name] B, MD  pravastatin (PRAVACHOL) 40 MG tablet Take 40 mg by mouth at bedtime. 05/17/16  Yes [provider]  propranolol (INDERAL) 40 MG tablet Take 1 tablet by mouth 2 (two) times daily. 04/26/19  Yes [provider]  pyridoxine (B-6) 100 MG tablet Take 100 mg by mouth daily.   Yes [provider]  torsemide (DEMADEX) 10 MG tablet Take 10 mg by mouth daily. 08/23/16  Yes [provider]  traZODone (DESYREL) 50 MG tablet Take 50-100 mg by mouth at bedtime as needed for sleep. 01/31/20  Yes [provider]    Family History  Problem Relation Age of Onset   Breast cancer Mother 11     Social History   Tobacco Use   Smoking status: Former    Types: Cigarettes   Smokeless tobacco: Former    Types: Snuff, Chew  Substance Use Topics   Alcohol use: No   Drug use: No    Allergies as of 09/18/2020 - Review Complete 09/18/2020  Allergen Reaction Noted   Influenza vaccines  10/26/2018   Crab extract allergy skin test  01/15/2020  Eggs or egg-derived products Swelling 11/29/2016   Shrimp (diagnostic)  01/15/2020    Physical Examination:  Constitutional: General:   Alert,  Well-developed, well-nourished, pleasant and cooperative in NAD BP (!) 159/84   Pulse 87   Temp 99.4 F (37.4 C) (Oral)   Ht 5\' 5"  (1.651 m)   Wt 124 lb (56.2 kg)   BMI 20.63 kg/m   Respiratory: Normal respiratory effort  Gastrointestinal:  Soft, non-tender and non-distended without masses, hepatosplenomegaly or hernias noted.  No guarding or rebound tenderness.     Cardiac: No  clubbing or edema.  No cyanosis. Normal posterior tibial pedal pulses noted.  Psych:  Alert and cooperative. Normal mood and affect.  Musculoskeletal:  Normal gait. Head normocephalic, atraumatic. Symmetrical without gross deformities. 5/5 Lower extremity strength bilaterally.  Skin: Warm. Intact without significant lesions or rashes. No jaundice.  Neck: Supple, trachea midline  Lymph: No cervical lymphadenopathy  Psych:  Alert and oriented x3, Alert and cooperative. Normal mood and affect.  Labs: CMP     Component Value Date/Time   NA 133 (L) 02/07/2020 0623   K 3.8 02/07/2020 0623   CL 98 02/07/2020 0623   CO2 25 02/07/2020 0623   GLUCOSE 91 02/07/2020 0623   BUN 7 (L) 02/07/2020 0623   BUN 15 10/17/2012 0805   CREATININE 1.00 02/07/2020 0623   CREATININE 1.16 10/17/2012 0805   CALCIUM 9.5 02/07/2020 0623   GFRNONAA >60 02/07/2020 0623   GFRNONAA 51 (L) 10/17/2012 0805   GFRAA 60 (L) 10/17/2012 0805   Lab Results  Component Value Date   WBC 6.2 06/09/2020   HGB 12.3 06/09/2020   HCT 35.2 (L) 06/09/2020   MCV 81.7 06/09/2020   PLT 359 06/09/2020    Imaging Studies:   Assessment and Plan:   Gina Nguyen is a 65 y.o. y/o female here for follow-up of abdominal pain, constipation and reflux  Constipation has resolved with Linzess Continue current dose  Abdominal pain has resolved at this time as well Reflux doing well on Protonix 40 mg once a day Patient willing to start decreasing the medication Start Protonix 20 mg once a day patient states that the pills that she has at home can be cut with a tablet cutter and she would prefer to do that.  Continue 20 mg once a day for 2 weeks and then stop the medication completely, to allow for H. pylori eradication testing in 2 weeks after that.  If symptoms return during this time patient advised to call [phone] and she verbalized understanding  Dr [place]

## 2020-09-18 NOTE — Patient Instructions (Signed)
Decrease Pantoprazole 40 MG to only 20 MG for 2 weeks, then stop  As of October 17th, you will need to come back to the office for a breath test with the lab.  The lab hours at our office are  Monday 1-4pm Tuesday-Wednesday 8:30am-4pm Thursday 1-4pm  We will see you back in the office in 3 months

## 2020-10-21 ENCOUNTER — Other Ambulatory Visit: Payer: Self-pay

## 2020-10-21 DIAGNOSIS — K219 Gastro-esophageal reflux disease without esophagitis: Secondary | ICD-10-CM

## 2020-10-23 LAB — H. PYLORI BREATH TEST: H pylori Breath Test: NEGATIVE

## 2020-10-31 ENCOUNTER — Other Ambulatory Visit: Payer: Self-pay | Admitting: Gastroenterology

## 2020-11-26 ENCOUNTER — Other Ambulatory Visit: Payer: Self-pay | Admitting: Gastroenterology

## 2020-12-11 ENCOUNTER — Encounter: Payer: Self-pay | Admitting: Gastroenterology

## 2020-12-11 ENCOUNTER — Ambulatory Visit (INDEPENDENT_AMBULATORY_CARE_PROVIDER_SITE_OTHER): Payer: 59 | Admitting: Gastroenterology

## 2020-12-11 VITALS — BP 135/87 | HR 88 | Temp 98.2°F | Wt 125.0 lb

## 2020-12-11 DIAGNOSIS — K219 Gastro-esophageal reflux disease without esophagitis: Secondary | ICD-10-CM

## 2020-12-11 DIAGNOSIS — Z8619 Personal history of other infectious and parasitic diseases: Secondary | ICD-10-CM | POA: Diagnosis not present

## 2020-12-11 DIAGNOSIS — K5900 Constipation, unspecified: Secondary | ICD-10-CM | POA: Diagnosis not present

## 2020-12-11 NOTE — Progress Notes (Signed)
Gina Bouillon, MD [address]  [address]  [place], [place] [address]  Main: [phone]  Fax: [phone]   Primary Care Physician: Lauro Regulus, MD   Chief Complaint  Patient presents with   Follow-up  Constipation  HPI: Gina Nguyen is a 65 y.o. female here for follow-up of abdominal pain and constipation.  Patient is currently taking Linzess and reports complete resolution of her constipation with the current dose.  Reports 1 soft bowel movement daily.  Denies any heartburn, abdominal pain, dysphagia or other reflux symptoms.  Is not on any PPI or is not having to take over-the-counter antacids.  H. pylori breath test was negative after she completed her treatment for H. pylori  Previous history: Patient hospitalized in Feb 2022 and underwent upper and lower endoscopy for iron deficiency anemia that showed a widely patent Schatzki's ring.  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 Pt denies dysphagia   Colonoscopy showed a fair prep and a single AVM in the colon that was treated with APC.  Diverticulosis was seen.  Patient is denying any episodes of bleeding.  ROS: All ROS reviewed and negative except as per HPI   Past Medical History:  Diagnosis Date   Allergy    Arthritis    Hypercholesterolemia    Hypertension    Peripheral vascular disease (HCC)     Past Surgical History:  Procedure Laterality Date   ABDOMINAL HYSTERECTOMY     BREAST CYST ASPIRATION Right    COLONOSCOPY N/A 02/09/2020   Procedure: COLONOSCOPY;  Surgeon: Pasty Spillers, MD;  Location: [facility] ENDOSCOPY;  Service: Gastroenterology;  Laterality: N/A;   ESOPHAGOGASTRODUODENOSCOPY N/A 02/09/2020   Procedure: ESOPHAGOGASTRODUODENOSCOPY (EGD);  Surgeon: Pasty Spillers, MD;  Location: [facility] ENDOSCOPY;  Service: Gastroenterology;  Laterality: N/A;   LOWER EXTREMITY ANGIOGRAPHY Left 01/15/2020   Procedure: LOWER EXTREMITY ANGIOGRAPHY;  Surgeon:  Renford Dills, MD;  Location: [facility] INVASIVE CV LAB;  Service: Cardiovascular;  Laterality: Left;    Prior to Admission medications   Medication Sig Start Date End Date Taking? Authorizing Provider  amLODipine (NORVASC) 5 MG tablet Take 5 mg by mouth daily. 06/16/16  Yes [provider]  Cholecalciferol (VITAMIN D-3) 25 MCG (1000 UT) CAPS Take by mouth.   Yes [provider]  clopidogrel (PLAVIX) 75 MG tablet Take by mouth. 02/13/20  Yes [provider]  Cyanocobalamin 1000 MCG TBCR Take 1,000 mcg by mouth daily.   Yes [provider]  linaclotide Karlene Einstein) 145 MCG CAPS capsule TAKE 1 CAPSULE DAILY 12/01/20  Yes Gina Nguyen B, MD  lisinopril (PRINIVIL,ZESTRIL) 20 MG tablet Take 20 mg by mouth daily. 01/23/16  Yes [provider]  nortriptyline (PAMELOR) 10 MG capsule Take 1 capsule nightly for one week, then increase to 2 capsules nightly. 03/24/20  Yes [provider]  pantoprazole (PROTONIX) 40 MG tablet Take 1 tablet (40 mg total) by mouth daily. 08/20/20  Yes Gina Nguyen B, MD  pravastatin (PRAVACHOL) 40 MG tablet Take 40 mg by mouth at bedtime. 05/17/16  Yes [provider]  propranolol (INDERAL) 40 MG tablet Take 1 tablet by mouth 2 (two) times daily. 04/26/19  Yes [provider]  pyridoxine (B-6) 100 MG tablet Take 100 mg by mouth daily.   Yes [provider]  torsemide (DEMADEX) 10 MG tablet Take 10 mg by mouth daily. 08/23/16  Yes [provider]  traZODone (DESYREL) 50 MG tablet Take 50-100 mg  by mouth at bedtime as needed for sleep. 01/31/20  Yes [provider]  vitamin E 1000 UNIT capsule Take 1,000 Units by mouth daily.   Yes [provider]    Family History  Problem Relation Age of Onset   Breast cancer Mother 58     Social History   Tobacco Use   Smoking status: Former    Types: Cigarettes   Smokeless tobacco: Former    Types: Snuff, Chew  Substance  Use Topics   Alcohol use: No   Drug use: No    Allergies as of 12/11/2020 - Review Complete 12/11/2020  Allergen Reaction Noted   Influenza vaccines  10/26/2018   Crab extract allergy skin test  01/15/2020   Eggs or egg-derived products Swelling 11/29/2016   Shrimp (diagnostic)  01/15/2020    Physical Examination:  Constitutional: General:   Alert,  Well-developed, well-nourished, pleasant and cooperative in NAD BP 135/87   Pulse 88   Temp 98.2 F (36.8 C) (Oral)   Wt 125 lb (56.7 kg)   BMI 20.80 kg/m   Respiratory: Normal respiratory effort  Gastrointestinal:  Soft, non-tender and non-distended without masses, hepatosplenomegaly or hernias noted.  No guarding or rebound tenderness.     Cardiac: No clubbing or edema.  No cyanosis. Normal posterior tibial pedal pulses noted.  Psych:  Alert and cooperative. Normal mood and affect.  Musculoskeletal:  Normal gait. Head normocephalic, atraumatic. Symmetrical without gross deformities. 5/5 Lower extremity strength bilaterally.  Skin: Warm. Intact without significant lesions or rashes. No jaundice.  Neck: Supple, trachea midline  Lymph: No cervical lymphadenopathy  Psych:  Alert and oriented x3, Alert and cooperative. Normal mood and affect.  Labs: CMP     Component Value Date/Time   NA 133 (L) 02/07/2020 0623   K 3.8 02/07/2020 0623   CL 98 02/07/2020 0623   CO2 25 02/07/2020 0623   GLUCOSE 91 02/07/2020 0623   BUN 7 (L) 02/07/2020 0623   BUN 15 10/17/2012 0805   CREATININE 1.00 02/07/2020 0623   CREATININE 1.16 10/17/2012 0805   CALCIUM 9.5 02/07/2020 0623   GFRNONAA >60 02/07/2020 0623   GFRNONAA 51 (L) 10/17/2012 0805   GFRAA 60 (L) 10/17/2012 0805   Lab Results  Component Value Date   WBC 6.2 06/09/2020   HGB 12.3 06/09/2020   HCT 35.2 (L) 06/09/2020   MCV 81.7 06/09/2020   PLT 359 06/09/2020    Imaging Studies:   Assessment and Plan:   Gina Nguyen is a 65 y.o. y/o female here for follow-up  of constipation, reflux and H. Pylori  Constipation resolved with current dose of Linzess Continue current dose High-fiber diet encouraged  Reflux well-controlled with lifestyle modifications and the patient is not needing any medications at this time H. pylori treatment completed and eradication testing was negative subsequently off PPI  If symptoms return patient advised to call us and she reported understanding  Hemoglobin normal recently  Dr Gina Nguyen

## 2020-12-18 ENCOUNTER — Ambulatory Visit: Payer: 59 | Admitting: Gastroenterology

## 2021-01-02 ENCOUNTER — Encounter: Payer: Self-pay | Admitting: Gastroenterology

## 2021-02-26 ENCOUNTER — Encounter (INDEPENDENT_AMBULATORY_CARE_PROVIDER_SITE_OTHER): Payer: Self-pay | Admitting: Vascular Surgery

## 2021-02-26 ENCOUNTER — Ambulatory Visit (INDEPENDENT_AMBULATORY_CARE_PROVIDER_SITE_OTHER): Payer: 59

## 2021-02-26 ENCOUNTER — Other Ambulatory Visit (INDEPENDENT_AMBULATORY_CARE_PROVIDER_SITE_OTHER): Payer: Self-pay | Admitting: Vascular Surgery

## 2021-02-26 ENCOUNTER — Ambulatory Visit (INDEPENDENT_AMBULATORY_CARE_PROVIDER_SITE_OTHER): Payer: 59 | Admitting: Nurse Practitioner

## 2021-02-26 ENCOUNTER — Encounter (INDEPENDENT_AMBULATORY_CARE_PROVIDER_SITE_OTHER): Payer: Self-pay | Admitting: Nurse Practitioner

## 2021-02-26 ENCOUNTER — Other Ambulatory Visit: Payer: Self-pay

## 2021-02-26 VITALS — BP 143/82 | HR 76 | Ht 65.0 in | Wt 125.0 lb

## 2021-02-26 DIAGNOSIS — E785 Hyperlipidemia, unspecified: Secondary | ICD-10-CM

## 2021-02-26 DIAGNOSIS — I70213 Atherosclerosis of native arteries of extremities with intermittent claudication, bilateral legs: Secondary | ICD-10-CM

## 2021-02-26 DIAGNOSIS — I10 Essential (primary) hypertension: Secondary | ICD-10-CM | POA: Diagnosis not present

## 2021-03-09 ENCOUNTER — Encounter (INDEPENDENT_AMBULATORY_CARE_PROVIDER_SITE_OTHER): Payer: Self-pay | Admitting: Nurse Practitioner

## 2021-03-09 NOTE — Progress Notes (Signed)
Subjective:    Patient ID: Gina Nguyen, female    DOB: 08/30/1955, 66 y.o.   MRN: [id number] Chief Complaint  Patient presents with   Follow-up    LLE arterial abi     Gina Nguyen is a 66 year old female that presents today for follow-up evaluation of known peripheral arterial disease.  The patient notes that her claudication symptoms have recently gotten worse.  She notes that she cannot walk to the end of her driveway without needing to stop several times.  This is beginning to become lifestyle limiting for her.  She currently denies rest pain or ulceration however.  Today noninvasive study showed ABI 1.01 on the right and 0.71 on the left.  Previous noninvasive study showed an ABI 0.94 on the right and 0.71 on the left.  Today noninvasive studies also show right lower extremity has monophasic waveforms throughout the lower extremity from the mid common femoral down to the distal tibial arteries.  There is a 50 to 74% stenosis of the distal SFA with an occluded anterior tibial artery.  Left lower extremity has triphasic waveforms in the common femoral artery however there is a 75 to 99% stenosis in the proximal SFA with monophasic waveforms extending to the distal tibial arteries.   Review of Systems  Cardiovascular:        Claudication  All other systems reviewed and are negative.     Objective:   Physical Exam Vitals reviewed.  HENT:     Head: Normocephalic.  Cardiovascular:     Rate and Rhythm: Normal rate.  Pulmonary:     Effort: Pulmonary effort is normal.  Skin:    General: Skin is warm and dry.  Neurological:     Mental Status: She is alert and oriented to person, place, and time.  Psychiatric:        Mood and Affect: Mood normal.        Behavior: Behavior normal.        Thought Content: Thought content normal.        Judgment: Judgment normal.    BP (!) 143/82    Pulse 76    Ht 5\' 5"  (1.651 m)    Wt 125 lb (56.7 kg)    BMI 20.80 kg/m   Past Medical History:   Diagnosis Date   Allergy    Arthritis    Hypercholesterolemia    Hypertension    Peripheral vascular disease (HCC)     Social History   Socioeconomic History   Marital status: Married    Spouse name: Not on file   Number of children: Not on file   Years of education: Not on file   Highest education level: Not on file  Occupational History   Not on file  Tobacco Use   Smoking status: Former    Types: Cigarettes   Smokeless tobacco: Former    Types: Snuff, Chew  Substance and Sexual Activity   Alcohol use: No   Drug use: No   Sexual activity: Not on file  Other Topics Concern   Not on file  Social History Narrative   Not on file   Social Determinants of Health   Financial Resource Strain: Not on file  Food Insecurity: Not on file  Transportation Needs: Not on file  Physical Activity: Not on file  Stress: Not on file  Social Connections: Not on file  Intimate Partner Violence: Not on file    Past Surgical History:  Procedure Laterality Date  ABDOMINAL HYSTERECTOMY     BREAST CYST ASPIRATION Right    COLONOSCOPY N/A 02/09/2020   Procedure: COLONOSCOPY;  Surgeon: Pasty Spillers, MD;  Location: [facility] ENDOSCOPY;  Service: Gastroenterology;  Laterality: N/A;   ESOPHAGOGASTRODUODENOSCOPY N/A 02/09/2020   Procedure: ESOPHAGOGASTRODUODENOSCOPY (EGD);  Surgeon: Pasty Spillers, MD;  Location: [facility] ENDOSCOPY;  Service: Gastroenterology;  Laterality: N/A;   LOWER EXTREMITY ANGIOGRAPHY Left 01/15/2020   Procedure: LOWER EXTREMITY ANGIOGRAPHY;  Surgeon: Renford Dills, MD;  Location: [facility] INVASIVE CV LAB;  Service: Cardiovascular;  Laterality: Left;    Family History  Problem Relation Age of Onset   Breast cancer Mother 23    Allergies  Allergen Reactions   Influenza Vaccines     Other reaction(s): Other (See Comments) Allergy to eggs   Crab Extract Allergy Skin Test     Vomiting    Eggs Or Egg-Derived Products Swelling    Vomiting, swelling of stomach  per patient    Shrimp (Diagnostic)     vomiting    CBC Latest Ref Rng & Units 06/09/2020 02/28/2020 02/09/2020  WBC 4.0 - 10.5 K/uL 6.2 6.5 9.7  Hemoglobin 12.0 - 15.0 g/dL 18.2 99.3 7.1(I)  Hematocrit 36.0 - 46.0 % 35.2(L) 39.8 24.6(L)  Platelets 150 - 400 K/uL 359 512(H) 354      CMP     Component Value Date/Time   NA 133 (L) 02/07/2020 0623   K 3.8 02/07/2020 0623   CL 98 02/07/2020 0623   CO2 25 02/07/2020 0623   GLUCOSE 91 02/07/2020 0623   BUN 7 (L) 02/07/2020 0623   BUN 15 10/17/2012 0805   CREATININE 1.00 02/07/2020 0623   CREATININE 1.16 10/17/2012 0805   CALCIUM 9.5 02/07/2020 0623   GFRNONAA >60 02/07/2020 0623   GFRNONAA 51 (L) 10/17/2012 0805   GFRAA 60 (L) 10/17/2012 0805     VAS [place] ABI WITH/WO TBI  Result Date: 03/02/2021  LOWER EXTREMITY DOPPLER STUDY Patient Name:  Gina Nguyen  Date of Exam:   02/26/2021 Medical Rec #: [id number]      Accession #:    [id number] Date of Birth: Jan 24, 1955      Patient Gender: F Patient Age:   45 years Exam Location:  [facility] Procedure:      VAS [place] ABI WITH/WO TBI Referring Phys: [facility] --------------------------------------------------------------------------------  Indications: Rest pain, and peripheral artery disease.  Vascular Interventions: Bilat CIA stents 2014                         01/15/2020 angiogram left with no intervention. Comparison Study: 08/21/2020 Performing Technologist: Jamse Mead RT, RDMS, RVT  Examination Guidelines: A complete evaluation includes at minimum, Doppler waveform signals and systolic blood pressure reading at the level of bilateral brachial, anterior tibial, and posterior tibial arteries, when vessel segments are accessible. Bilateral testing is considered an integral part of a complete examination. Photoelectric Plethysmograph (PPG) waveforms and toe systolic pressure readings are included as required and additional duplex testing as needed. Limited examinations for  reoccurring indications may be performed as noted.  ABI Findings: +---------+------------------+-----+----------+----------------------+  Right     Rt Pressure (mmHg) Index Waveform   Comment                 +---------+------------------+-----+----------+----------------------+  Brachial  138                                                         +---------+------------------+-----+----------+----------------------+  ATA                                           not detected by duplex  +---------+------------------+-----+----------+----------------------+  PTA       121                0.88  monophasic                         +---------+------------------+-----+----------+----------------------+  PERO                               monophasic                         +---------+------------------+-----+----------+----------------------+  Great Toe 58                 0.42  Abnormal                           +---------+------------------+-----+----------+----------------------+ +---------+------------------+-----+----------+-------+  Left      Lt Pressure (mmHg) Index Waveform   Comment  +---------+------------------+-----+----------+-------+  Brachial  137                                          +---------+------------------+-----+----------+-------+  ATA       78                 0.57  monophasic          +---------+------------------+-----+----------+-------+  PTA       87                 0.63  monophasic          +---------+------------------+-----+----------+-------+  Great Toe 39                 0.28  Abnormal            +---------+------------------+-----+----------+-------+ +-------+-----------+-----------+------------+------------+  ABI/TBI Today's ABI Today's TBI Previous ABI Previous TBI  +-------+-----------+-----------+------------+------------+  Right   .88         .42         1.01         .67           +-------+-----------+-----------+------------+------------+  Left    .63         .28         .71           .66           +-------+-----------+-----------+------------+------------+ Left ABIs appear essentially unchanged compared to prior study on 08/2020. Right ABIs appear decreased.  Summary: Right: Resting right ankle-brachial index indicates mild right lower extremity arterial disease. The right toe-brachial index is abnormal. Left: Resting left ankle-brachial index indicates moderate left lower extremity arterial disease. The left toe-brachial index is abnormal.  *See table(s) above for measurements and observations.  Electronically signed by Levora Dredge MD on 03/02/2021 at 1:02:20 PM.    Final        Assessment & Plan:   1. Atherosclerosis of native artery of both lower extremities with intermittent claudication (HCC) Previous angiogram with the patient show that her anatomy is not amenable  to endovascular intervention any repair will need to be surgical in nature.  Given the discussion the patient would feel more comfortable discussing with Dr. Gilda Crease which is understandable.  We will have the patient return in 1 to 2 weeks to discuss intervention.  2. Essential hypertension Continue antihypertensive medications as already ordered, these medications have been reviewed and there are no changes at this time.   3. Hyperlipidemia, unspecified hyperlipidemia type Continue statin as ordered and reviewed, no changes at this time    Current Outpatient Medications on File Prior to Visit  Medication Sig Dispense Refill   amLODipine (NORVASC) 5 MG tablet Take 5 mg by mouth daily.     Cholecalciferol (VITAMIN D-3) 25 MCG (1000 UT) CAPS Take by mouth.     clopidogrel (PLAVIX) 75 MG tablet Take by mouth.     Cyanocobalamin 1000 MCG TBCR Take 1,000 mcg by mouth daily.     linaclotide (LINZESS) 145 MCG CAPS capsule TAKE 1 CAPSULE DAILY 90 capsule 0   lisinopril (PRINIVIL,ZESTRIL) 20 MG tablet Take 20 mg by mouth daily.     nortriptyline (PAMELOR) 10 MG capsule Take 1 capsule nightly for one week, then  increase to 2 capsules nightly.     pantoprazole (PROTONIX) 40 MG tablet Take 1 tablet (40 mg total) by mouth daily. 90 tablet 0   pravastatin (PRAVACHOL) 40 MG tablet Take 40 mg by mouth at bedtime.     propranolol (INDERAL) 40 MG tablet Take 1 tablet by mouth 2 (two) times daily.     pyridoxine (B-6) 100 MG tablet Take 100 mg by mouth daily.     torsemide (DEMADEX) 10 MG tablet Take 10 mg by mouth daily.     traZODone (DESYREL) 50 MG tablet Take 50-100 mg by mouth at bedtime as needed for sleep.     vitamin E 1000 UNIT capsule Take 1,000 Units by mouth daily.     No current facility-administered medications on file prior to visit.    There are no Patient Instructions on file for this visit. No follow-ups on file.   Georgiana Spinner, NP

## 2021-03-16 ENCOUNTER — Ambulatory Visit (INDEPENDENT_AMBULATORY_CARE_PROVIDER_SITE_OTHER): Payer: 59 | Admitting: Vascular Surgery

## 2021-03-16 ENCOUNTER — Other Ambulatory Visit: Payer: Self-pay

## 2021-03-16 ENCOUNTER — Encounter (INDEPENDENT_AMBULATORY_CARE_PROVIDER_SITE_OTHER): Payer: Self-pay | Admitting: Vascular Surgery

## 2021-03-16 VITALS — BP 154/93 | HR 88 | Resp 16 | Wt 129.8 lb

## 2021-03-16 DIAGNOSIS — E782 Mixed hyperlipidemia: Secondary | ICD-10-CM | POA: Diagnosis not present

## 2021-03-16 DIAGNOSIS — I10 Essential (primary) hypertension: Secondary | ICD-10-CM | POA: Diagnosis not present

## 2021-03-16 DIAGNOSIS — M47817 Spondylosis without myelopathy or radiculopathy, lumbosacral region: Secondary | ICD-10-CM

## 2021-03-16 DIAGNOSIS — I70213 Atherosclerosis of native arteries of extremities with intermittent claudication, bilateral legs: Secondary | ICD-10-CM | POA: Diagnosis not present

## 2021-03-16 NOTE — Progress Notes (Unsigned)
MRN : [id number]  Gina Nguyen is a 66 y.o. (October 25, 1955) female who presents with chief complaint of ***.  History of Present Illness:   The patient returns to the office for followup and review status post angiogram withou intervention.   Procedure 01/15/2020: The left common femoral is patent.  There is mild calcific plaque noted.  It is very short in length with a high femoral bifurcation.  The ostia of the SFA demonstrates a 60 to 70% stenosis.  There is a 50% stenosis in the ostia of the profunda femoris.  In the mid SFA there is a 70% stenosis.  Distally at Hunter's canal the SFA and popliteal are patent without evidence of hemodynamically significant stenosis.  The trifurcation is profoundly diseased and there is occlusion of the anterior tibial and posterior tibial throughout their entire length.  Neither is reconstituted distally.  The tibioperoneal trunk and peroneal are heavily diseased and there are multiple occlusions before the distal peroneal is reconstituted with 2 large collaterals filling the dorsalis pedis and the plantar vessels.  The patient notes worsening of her lower extremity symptoms. + interval shortening of the patient's claudication distance and now she is having rest pain symptoms. Previous wounds have now healed.  No new ulcers or wounds have occurred since the last visit.  There have been no significant changes to the patient's overall health care.  The patient denies amaurosis fugax or recent TIA symptoms. There are no recent neurological changes noted. The patient denies history of DVT, PE or superficial thrombophlebitis. The patient denies recent episodes of angina or shortness of breath.   ABI's Rt=*** and Lt=***  (previous ABI's Rt=*** and Lt=***) Duplex US of the *** lower extremity arterial system shows ***   No outpatient medications have been marked as taking for the 03/16/21 encounter (Appointment) with Gilda Crease, Latina Craver, MD.    Past Medical  History:  Diagnosis Date   Allergy    Arthritis    Hypercholesterolemia    Hypertension    Peripheral vascular disease (HCC)     Past Surgical History:  Procedure Laterality Date   ABDOMINAL HYSTERECTOMY     BREAST CYST ASPIRATION Right    COLONOSCOPY N/A 02/09/2020   Procedure: COLONOSCOPY;  Surgeon: Pasty Spillers, MD;  Location: [facility] ENDOSCOPY;  Service: Gastroenterology;  Laterality: N/A;   ESOPHAGOGASTRODUODENOSCOPY N/A 02/09/2020   Procedure: ESOPHAGOGASTRODUODENOSCOPY (EGD);  Surgeon: Pasty Spillers, MD;  Location: [facility] ENDOSCOPY;  Service: Gastroenterology;  Laterality: N/A;   LOWER EXTREMITY ANGIOGRAPHY Left 01/15/2020   Procedure: LOWER EXTREMITY ANGIOGRAPHY;  Surgeon: Renford Dills, MD;  Location: [facility] INVASIVE CV LAB;  Service: Cardiovascular;  Laterality: Left;    Social History Social History   Tobacco Use   Smoking status: Former    Types: Cigarettes   Smokeless tobacco: Former    Types: Snuff, Chew  Substance Use Topics   Alcohol use: No   Drug use: No    Family History Family History  Problem Relation Age of Onset   Breast cancer Mother 86    Allergies  Allergen Reactions   Influenza Vaccines     Other reaction(s): Other (See Comments) Allergy to eggs   Crab Extract Allergy Skin Test     Vomiting    Eggs Or Egg-Derived Products Swelling    Vomiting, swelling of stomach per patient    Shrimp (Diagnostic)     vomiting     REVIEW OF SYSTEMS (Negative unless checked)  Constitutional: [] Weight loss  []   Fever  [] Chills Cardiac: [] Chest pain   [] Chest pressure   [] Palpitations   [] Shortness of breath when laying flat   [] Shortness of breath with exertion. Vascular:  [] Pain in legs with walking   [] Pain in legs at rest  [] History of DVT   [] Phlebitis   [] Swelling in legs   [] Varicose veins   [] Non-healing ulcers Pulmonary:   [] Uses home oxygen   [] Productive cough   [] Hemoptysis   [] Wheeze  [] COPD   [] Asthma Neurologic:  [] Dizziness    [] Seizures   [] History of stroke   [] History of TIA  [] Aphasia   [] Vissual changes   [] Weakness or numbness in arm   [] Weakness or numbness in leg Musculoskeletal:   [] Joint swelling   [] Joint pain   [] Low back pain Hematologic:  [] Easy bruising  [] Easy bleeding   [] Hypercoagulable state   [] Anemic Gastrointestinal:  [] Diarrhea   [] Vomiting  [] Gastroesophageal reflux/heartburn   [] Difficulty swallowing. Genitourinary:  [] Chronic kidney disease   [] Difficult urination  [] Frequent urination   [] Blood in urine Skin:  [] Rashes   [] Ulcers  Psychological:  [] History of anxiety   []  History of major depression.  Physical Examination  There were no vitals filed for this visit. There is no height or weight on file to calculate BMI. Gen: WD/WN, NAD Head: [place]/AT, No temporalis wasting.  Ear/Nose/Throat: Hearing grossly intact, nares w/o erythema or drainage Eyes: PER, EOMI, sclera nonicteric.  Neck: Supple, no masses.  No bruit or JVD.  Pulmonary:  Good air movement, no audible wheezing, no use of accessory muscles.  Cardiac: RRR, normal S1, S2, no Murmurs. Vascular:  *** Vessel Right Left  Radial Palpable Palpable  Carotid Palpable Palpable  PT Palpable Palpable  DP Palpable Palpable  Gastrointestinal: soft, non-distended. No guarding/no peritoneal signs.  Musculoskeletal: M/S 5/5 throughout.  No visible deformity.  Neurologic: CN 2-12 intact. Pain and light touch intact in extremities.  Symmetrical.  Speech is fluent. Motor exam as listed above. Psychiatric: Judgment intact, Mood & affect appropriate for pt's clinical situation. Dermatologic: No rashes or ulcers noted.  No changes consistent with cellulitis.   CBC Lab Results  Component Value Date   WBC 6.2 06/09/2020   HGB 12.3 06/09/2020   HCT 35.2 (L) 06/09/2020   MCV 81.7 06/09/2020   PLT 359 06/09/2020    BMET    Component Value Date/Time   NA 133 (L) 02/07/2020 0623   K 3.8 02/07/2020 0623   CL 98 02/07/2020 0623   CO2 25  02/07/2020 0623   GLUCOSE 91 02/07/2020 0623   BUN 7 (L) 02/07/2020 0623   BUN 15 10/17/2012 0805   CREATININE 1.00 02/07/2020 0623   CREATININE 1.16 10/17/2012 0805   CALCIUM 9.5 02/07/2020 0623   GFRNONAA >60 02/07/2020 0623   GFRNONAA 51 (L) 10/17/2012 0805   GFRAA 60 (L) 10/17/2012 0805   CrCl cannot be calculated (Patient's most recent lab result is older than the maximum 21 days allowed.).  COAG Lab Results  Component Value Date   INR 1.1 02/05/2020    Radiology VAS US ABI WITH/WO TBI  Result Date: 03/02/2021  LOWER EXTREMITY DOPPLER STUDY Patient Name:  Algie Cofferdna E Wicklund  Date of Exam:   02/26/2021 Medical Rec #: [id number]      Accession #:    [id number] Date of Birth: April 01, 1955      Patient Gender: F Patient Age:   [age] years Exam Location:  [facility] Procedure:      VAS US ABI  WITH/WO TBI Referring Phys: [facility] --------------------------------------------------------------------------------  Indications: Rest pain, and peripheral artery disease.  Vascular Interventions: Bilat CIA stents 2014                         01/15/2020 angiogram left with no intervention. Comparison Study: 08/21/2020 Performing Technologist: Jamse Mead RT, RDMS, RVT  Examination Guidelines: A complete evaluation includes at minimum, Doppler waveform signals and systolic blood pressure reading at the level of bilateral brachial, anterior tibial, and posterior tibial arteries, when vessel segments are accessible. Bilateral testing is considered an integral part of a complete examination. Photoelectric Plethysmograph (PPG) waveforms and toe systolic pressure readings are included as required and additional duplex testing as needed. Limited examinations for reoccurring indications may be performed as noted.  ABI Findings: +---------+------------------+-----+----------+----------------------+  Right     Rt Pressure (mmHg) Index Waveform   Comment                  +---------+------------------+-----+----------+----------------------+  Brachial  138                                                         +---------+------------------+-----+----------+----------------------+  ATA                                           not detected by duplex  +---------+------------------+-----+----------+----------------------+  PTA       121                0.88  monophasic                         +---------+------------------+-----+----------+----------------------+  PERO                               monophasic                         +---------+------------------+-----+----------+----------------------+  Great Toe 58                 0.42  Abnormal                           +---------+------------------+-----+----------+----------------------+ +---------+------------------+-----+----------+-------+  Left      Lt Pressure (mmHg) Index Waveform   Comment  +---------+------------------+-----+----------+-------+  Brachial  137                                          +---------+------------------+-----+----------+-------+  ATA       78                 0.57  monophasic          +---------+------------------+-----+----------+-------+  PTA       87                 0.63  monophasic          +---------+------------------+-----+----------+-------+  Great Toe 39  0.28  Abnormal            +---------+------------------+-----+----------+-------+ +-------+-----------+-----------+------------+------------+  ABI/TBI Today's ABI Today's TBI Previous ABI Previous TBI  +-------+-----------+-----------+------------+------------+  Right   .88         .42         1.01         .67           +-------+-----------+-----------+------------+------------+  Left    .63         .28         .71          .66           +-------+-----------+-----------+------------+------------+ Left ABIs appear essentially unchanged compared to prior study on 08/2020. Right ABIs appear decreased.  Summary: Right: Resting  right ankle-brachial index indicates mild right lower extremity arterial disease. The right toe-brachial index is abnormal. Left: Resting left ankle-brachial index indicates moderate left lower extremity arterial disease. The left toe-brachial index is abnormal.  *See table(s) above for measurements and observations.  Electronically signed by Levora Dredge MD on 03/02/2021 at 1:02:20 PM.    Final    VAS [place] LOWER EXTREMITY ARTERIAL DUPLEX  Result Date: 03/02/2021 LOWER EXTREMITY ARTERIAL DUPLEX STUDY Patient Name:  BRADI ARBUTHNOT  Date of Exam:   02/26/2021 Medical Rec #: [id number]      Accession #:    [id number] Date of Birth: [birth year]      Patient Gender: F Patient Age:   74 years Exam Location:  [facility] Procedure:      VAS [place] LOWER EXTREMITY ARTERIAL DUPLEX Referring Phys: Levora Dredge --------------------------------------------------------------------------------  Indications: Rest pain, and peripheral artery disease.  Vascular Interventions: Bilat CIA stents 2014. Current ABI:            R=0.88 & L=0.63 Performing Technologist: Jamse Mead RT, RDMS, RVT  Examination Guidelines: A complete evaluation includes B-mode imaging, spectral Doppler, color Doppler, and power Doppler as needed of all accessible portions of each vessel. Bilateral testing is considered an integral part of a complete examination. Limited examinations for reoccurring indications may be performed as noted.  +-----------+--------+-----+---------------+-------------------------+--------+  RIGHT       PSV cm/s Ratio Stenosis        Waveform                  Comments  +-----------+--------+-----+---------------+-------------------------+--------+  CFA Mid     117                            monophasic/mild hyperemia           +-----------+--------+-----+---------------+-------------------------+--------+  DFA         125                            monophasic/mild hyperemia            +-----------+--------+-----+---------------+-------------------------+--------+  SFA Prox    122                            monophasic/mild hyperemia           +-----------+--------+-----+---------------+-------------------------+--------+  SFA Mid     109                            monophasic                          +-----------+--------+-----+---------------+-------------------------+--------+  SFA Distal  201            50-74% stenosis monophasic                          +-----------+--------+-----+---------------+-------------------------+--------+  POP Prox    106                            monophasic                          +-----------+--------+-----+---------------+-------------------------+--------+  POP Distal  79                             monophasic                          +-----------+--------+-----+---------------+-------------------------+--------+  ATA Distal                 occluded                                            +-----------+--------+-----+---------------+-------------------------+--------+  PTA Distal  76                             monophasic                          +-----------+--------+-----+---------------+-------------------------+--------+  PERO Distal 63                             monophasic                          +-----------+--------+-----+---------------+-------------------------+--------+  +----------+--------+-----+---------------+------------------------+--------+  LEFT       PSV cm/s Ratio Stenosis        Waveform                 Comments  +----------+--------+-----+---------------+------------------------+--------+  CFA Mid    85                             triphasic/mild hyperemia           +----------+--------+-----+---------------+------------------------+--------+  DFA        129                            triphasic/mild hyperemia           +----------+--------+-----+---------------+------------------------+--------+  SFA Prox   459            75-99%  stenosis monophasic                         +----------+--------+-----+---------------+------------------------+--------+  SFA Mid    139                            monophasic                         +----------+--------+-----+---------------+------------------------+--------+  SFA Distal 185  30-49% stenosis monophasic                         +----------+--------+-----+---------------+------------------------+--------+  POP Prox   67                             monophasic                         +----------+--------+-----+---------------+------------------------+--------+  POP Distal 45                             monophasic                         +----------+--------+-----+---------------+------------------------+--------+  ATA Distal 47                             monophasic                         +----------+--------+-----+---------------+------------------------+--------+  PTA Distal 26                             monophasic                         +----------+--------+-----+---------------+------------------------+--------+  Summary: Right: 50-74% stenosis noted in the superficial femoral artery. Total occlusion noted in the anterior tibial artery. Left: 75-99% stenosis noted in the superficial femoral artery origin.  See table(s) above for measurements and observations. Electronically signed by Levora Dredge MD on 03/02/2021 at 1:02:29 PM.    Final      Assessment/Plan There are no diagnoses linked to this encounter.   Levora Dredge, MD  03/16/2021 9:44 AM

## 2021-03-22 ENCOUNTER — Encounter (INDEPENDENT_AMBULATORY_CARE_PROVIDER_SITE_OTHER): Payer: Self-pay | Admitting: Vascular Surgery

## 2021-05-01 ENCOUNTER — Telehealth (INDEPENDENT_AMBULATORY_CARE_PROVIDER_SITE_OTHER): Payer: Self-pay | Admitting: Vascular Surgery

## 2021-05-01 ENCOUNTER — Encounter (INDEPENDENT_AMBULATORY_CARE_PROVIDER_SITE_OTHER): Payer: Self-pay | Admitting: Vascular Surgery

## 2021-05-01 NOTE — Telephone Encounter (Signed)
Tammy with [employer] [phone]) called me to discuss pt's insurance. Tammy states that [employer] is now out of network for [place] and we have to get a prior auth for [place]/visits. The pt does have an outstanding balance of $1,325 with [place] due to no PA being obtained. Tammy was trying to get somewhere with [employer] but was left on hold and forgotten about. I did discuss this new information with pt and she states that she was not aware. Per Melonie [place] states that all [employer] members were told. Pt has appts with [place] in May and September. After explaining the situation with the pt, I inquired if she wants me to initiate a PA. She stated that she would talk to her husband and call the insurance and get back with me. I advised to not wait to long as if she is coming to the May appt, I will need to start the PA. Pt acknowledged. I also stated to Tammy and pt that I would attempt to initiate a back dated PA for her visits beginning 01/2021. I advised that I did not know if it would be successful or not, but I would try.  ? ?Nothing further needed at this time - until I speak with pt again.    ?

## 2021-05-01 NOTE — Telephone Encounter (Signed)
Pt called insurance and we are not in network any longer. Pt stated to cancel all appointments. I advised that I would cancel all appts and let her know to call if she needs anything further from the practice.  ?

## 2021-05-05 ENCOUNTER — Encounter (INDEPENDENT_AMBULATORY_CARE_PROVIDER_SITE_OTHER): Payer: Self-pay | Admitting: Vascular Surgery

## 2021-05-06 NOTE — Telephone Encounter (Signed)
Patient will be calling tomorrow with fax number for Dr Pricilla Holm office to send requesting records ?

## 2021-05-15 ENCOUNTER — Encounter (INDEPENDENT_AMBULATORY_CARE_PROVIDER_SITE_OTHER): Payer: Self-pay | Admitting: Vascular Surgery

## 2021-05-28 ENCOUNTER — Encounter (INDEPENDENT_AMBULATORY_CARE_PROVIDER_SITE_OTHER): Payer: 59

## 2021-05-28 ENCOUNTER — Ambulatory Visit (INDEPENDENT_AMBULATORY_CARE_PROVIDER_SITE_OTHER): Payer: 59 | Admitting: Vascular Surgery

## 2021-07-17 ENCOUNTER — Other Ambulatory Visit: Payer: Self-pay | Admitting: Internal Medicine

## 2021-07-17 DIAGNOSIS — Z1231 Encounter for screening mammogram for malignant neoplasm of breast: Secondary | ICD-10-CM

## 2021-08-31 ENCOUNTER — Ambulatory Visit
Admission: RE | Admit: 2021-08-31 | Discharge: 2021-08-31 | Disposition: A | Discharge status: Home / Self Care | Payer: 59 | Source: Ambulatory Visit | Attending: Internal Medicine | Admitting: Internal Medicine

## 2021-08-31 DIAGNOSIS — Z1231 Encounter for screening mammogram for malignant neoplasm of breast: Secondary | ICD-10-CM | POA: Diagnosis present

## 2021-09-14 ENCOUNTER — Ambulatory Visit (INDEPENDENT_AMBULATORY_CARE_PROVIDER_SITE_OTHER): Payer: 59 | Admitting: Vascular Surgery

## 2021-09-14 ENCOUNTER — Encounter (INDEPENDENT_AMBULATORY_CARE_PROVIDER_SITE_OTHER): Payer: 59

## 2021-11-02 ENCOUNTER — Encounter (INDEPENDENT_AMBULATORY_CARE_PROVIDER_SITE_OTHER): Payer: Self-pay

## 2022-05-05 NOTE — Telephone Encounter (Signed)
Error

## 2022-06-07 IMAGING — DX DG CHEST 1V PORT
1 series · 1 of 1 positions shown · non-contrast
Comparison: None.

CLINICAL DATA: Shortness of breath.

EXAM:
PORTABLE CHEST 1 VIEW

[chest ap]
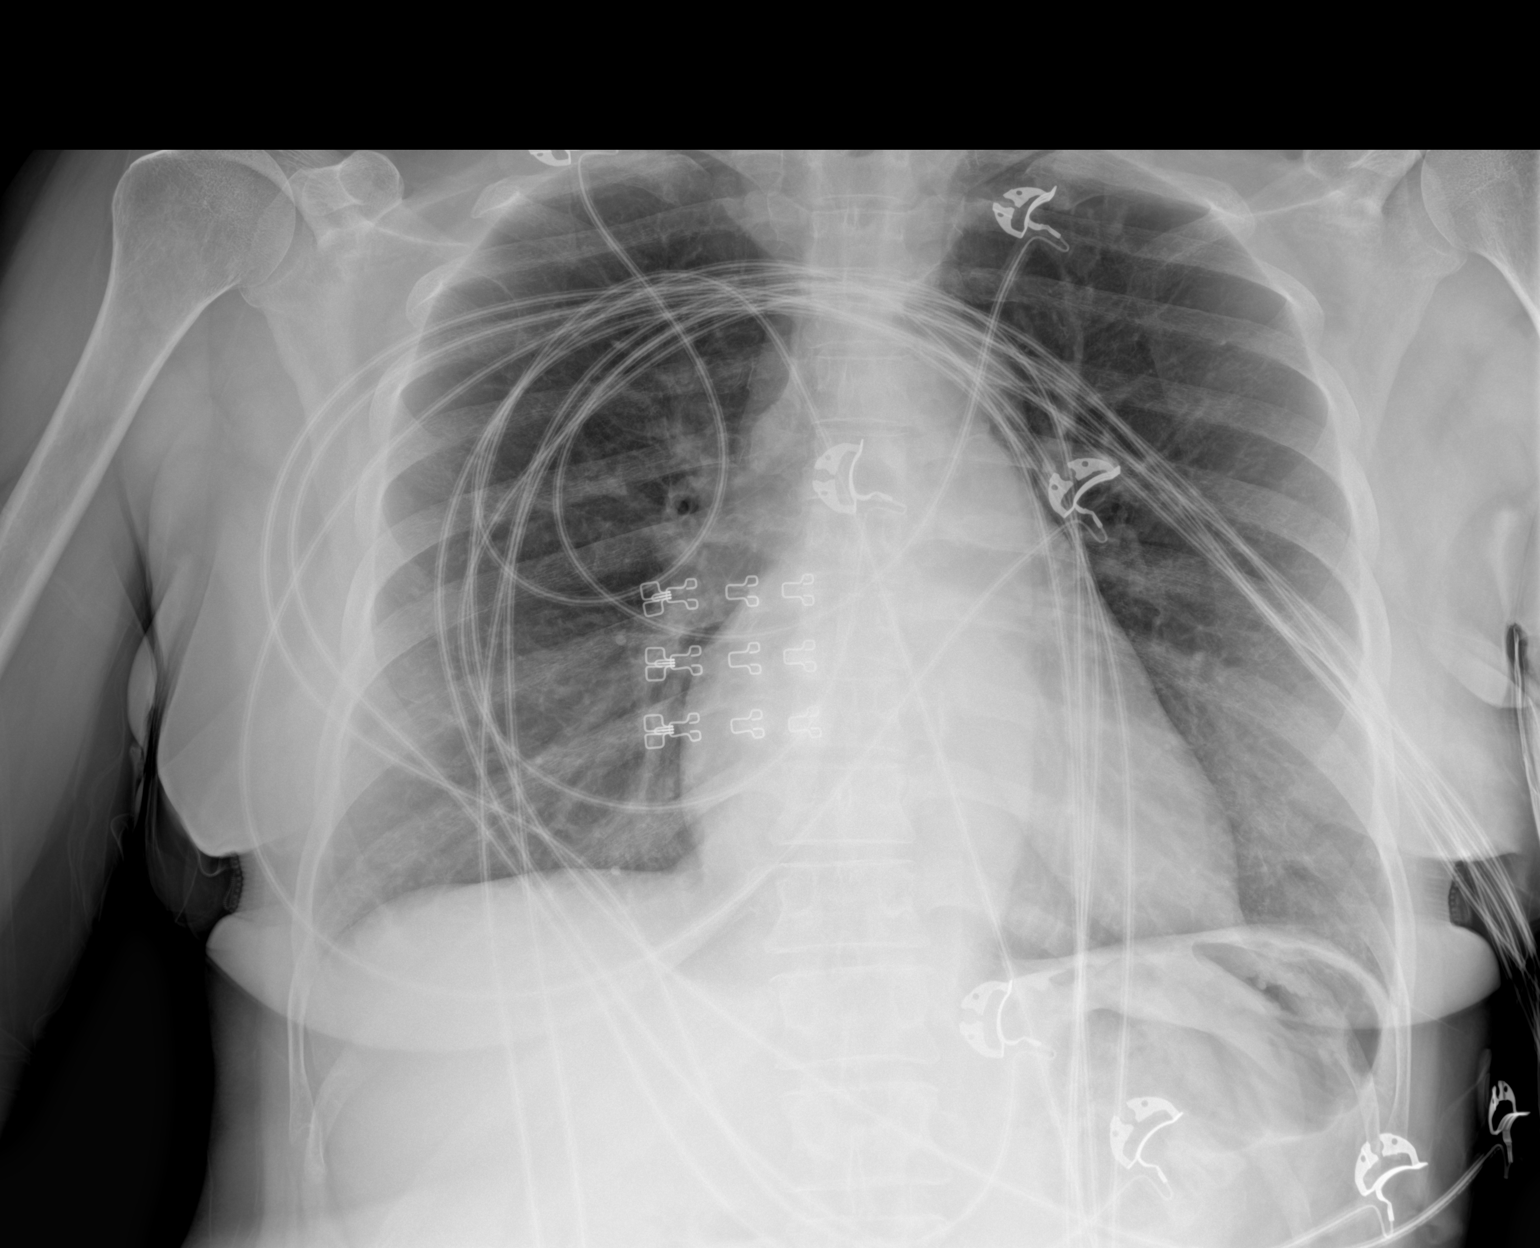

[1 of 1 positions shown; findings below may reference images not displayed]

FINDINGS: The heart size and mediastinal contours are within normal limits.
Both lungs are clear. The visualized skeletal structures are
unremarkable.
IMPRESSION: No active disease.

## 2022-07-19 ENCOUNTER — Other Ambulatory Visit: Payer: Self-pay | Admitting: Internal Medicine

## 2022-07-19 DIAGNOSIS — Z1231 Encounter for screening mammogram for malignant neoplasm of breast: Secondary | ICD-10-CM

## 2022-09-02 ENCOUNTER — Ambulatory Visit
Admission: RE | Admit: 2022-09-02 | Discharge: 2022-09-02 | Disposition: A | Discharge status: Home / Self Care | Payer: 59 | Source: Ambulatory Visit | Attending: Internal Medicine | Admitting: Internal Medicine

## 2022-09-02 DIAGNOSIS — Z1231 Encounter for screening mammogram for malignant neoplasm of breast: Secondary | ICD-10-CM | POA: Insufficient documentation

## 2022-09-12 IMAGING — US US ABDOMEN LIMITED RUQ/ASCITES
1 series · 14 of 25 positions shown · non-contrast
Comparison: CT December 14, 2012

CLINICAL DATA: One month of right upper quadrant abdominal pain.

EXAM:
ULTRASOUND ABDOMEN LIMITED RIGHT UPPER QUADRANT

[Series 1: general · 14 of 58 slices shown]
[im 1/58]
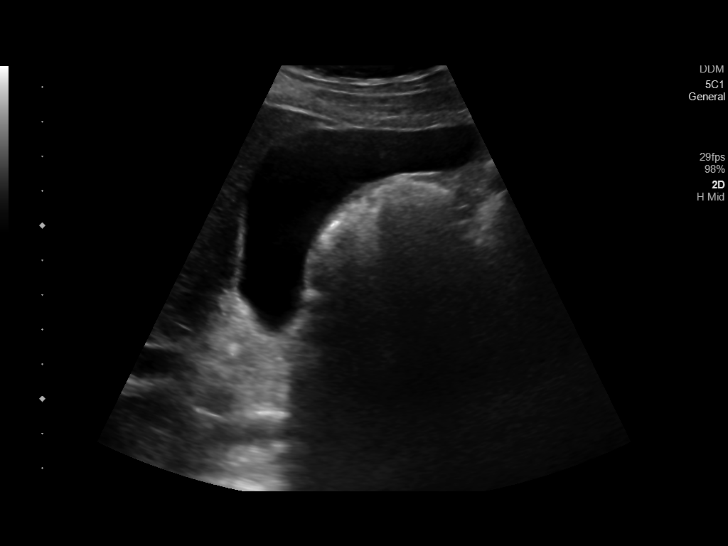
[im 5/58]
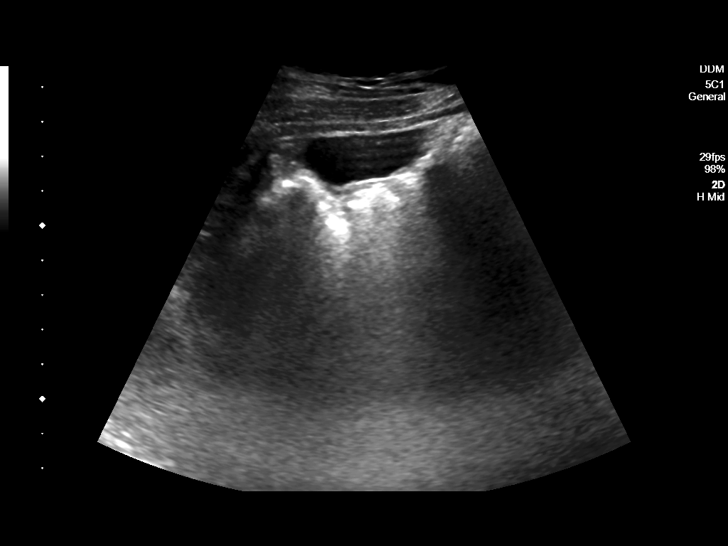
[im 10/58]
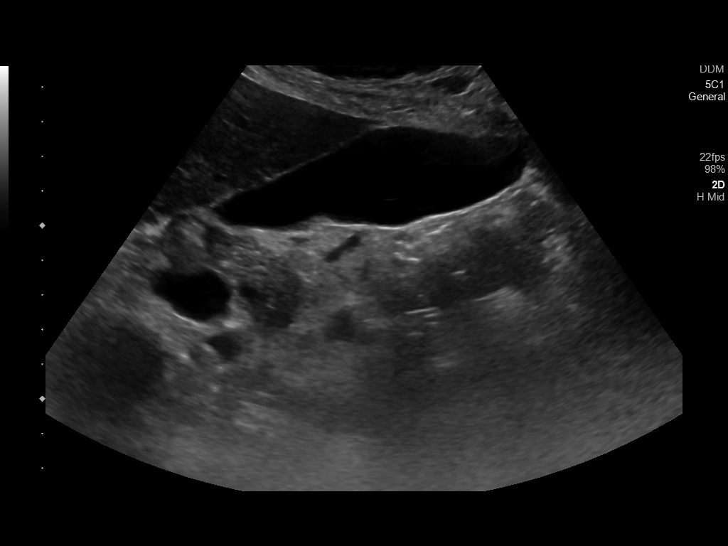
[im 15/58]
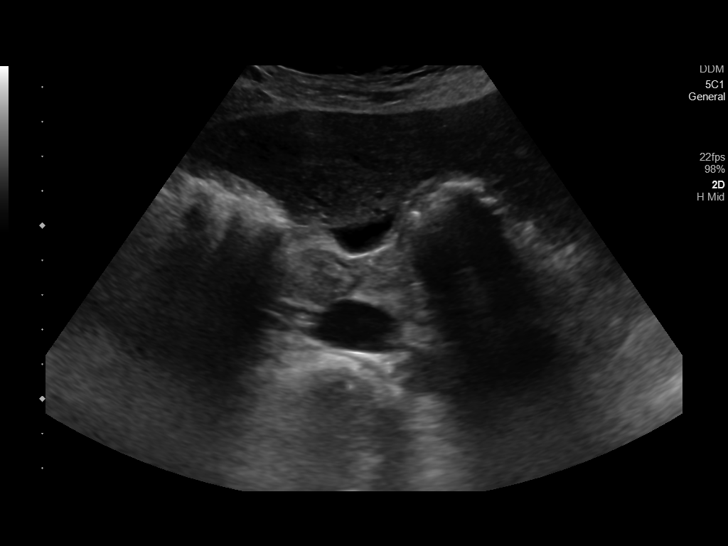
[im 20/58]
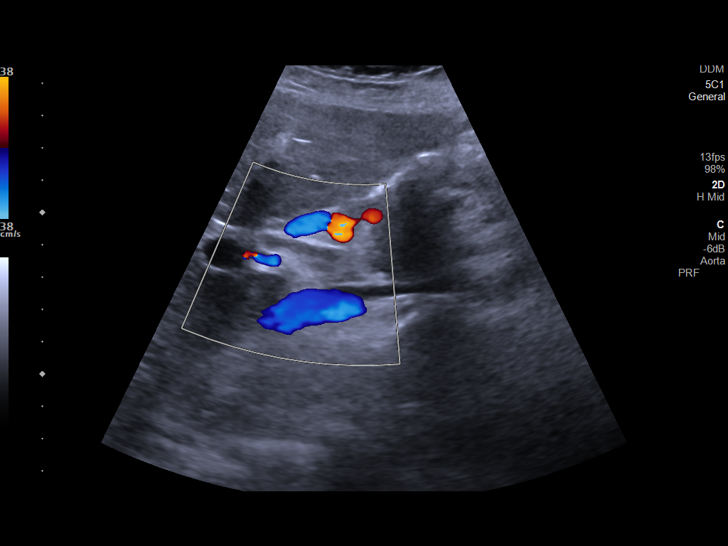
[im 22/58]
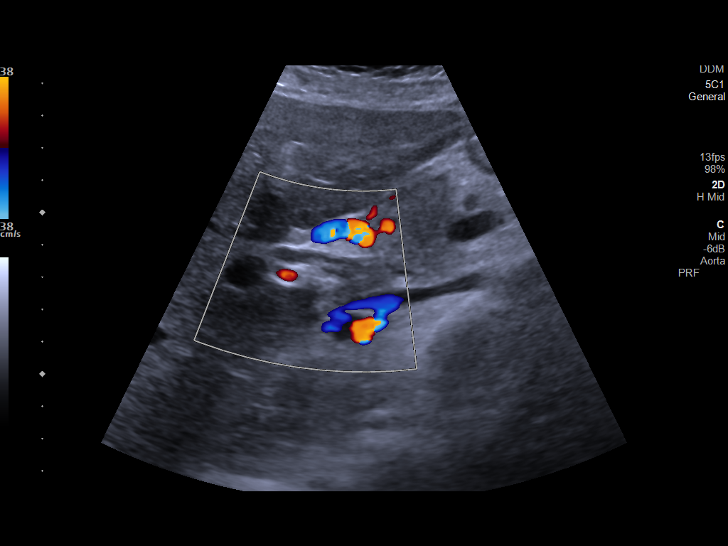
[im 27/58]
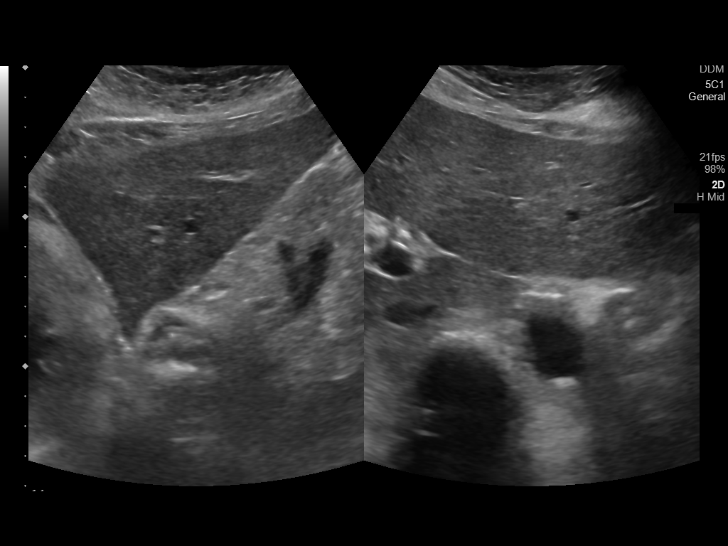
[im 31/58]
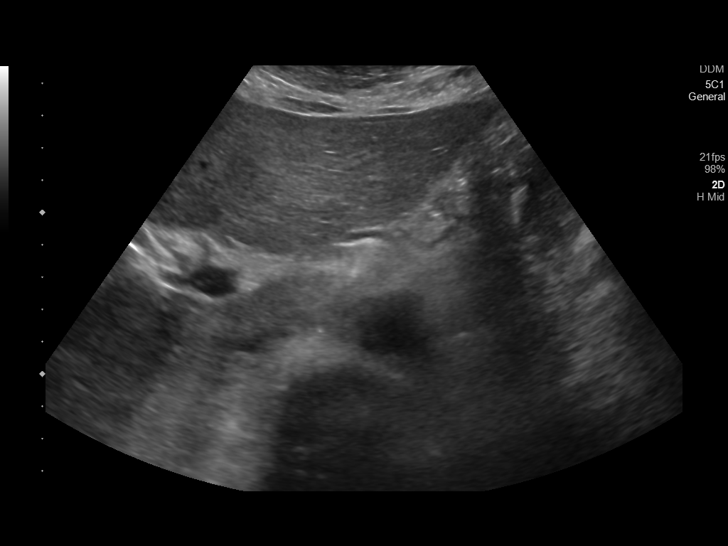
[im 36/58]
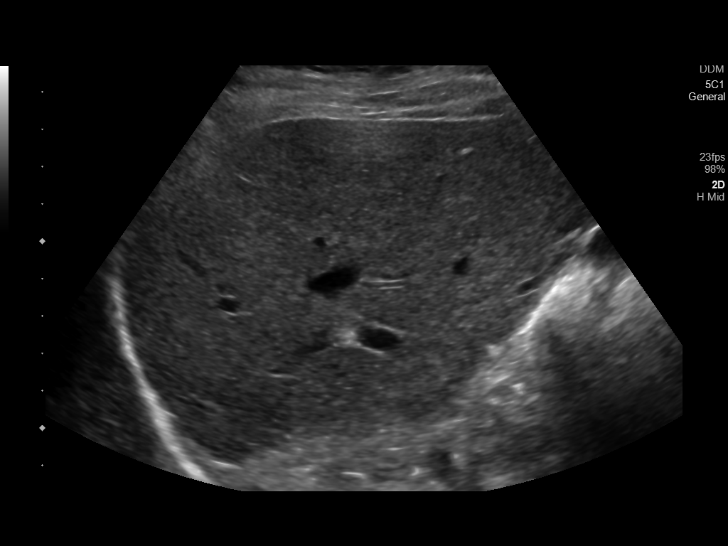
[im 39/58]
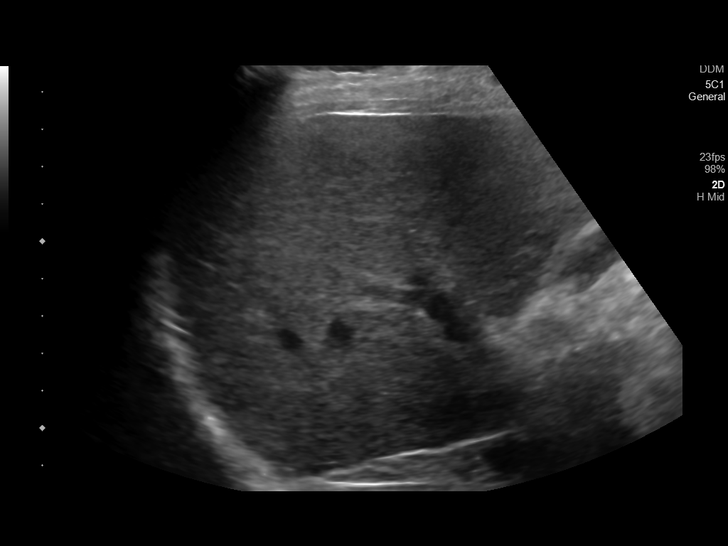
[im 43/58]
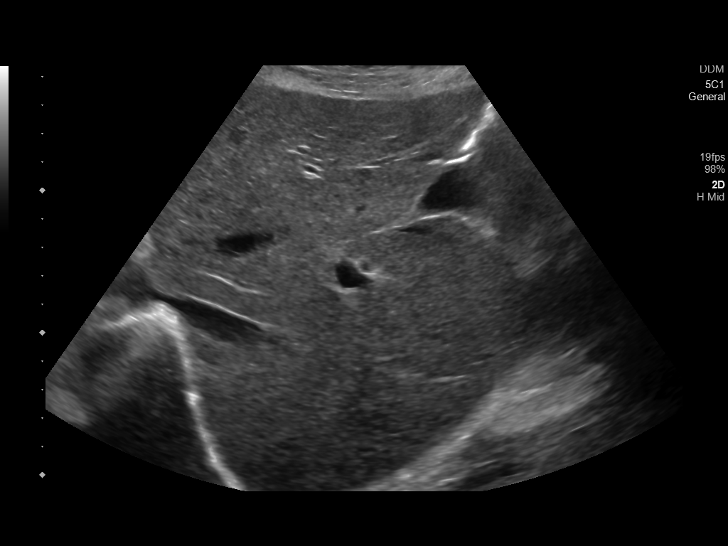
[im 48/58]
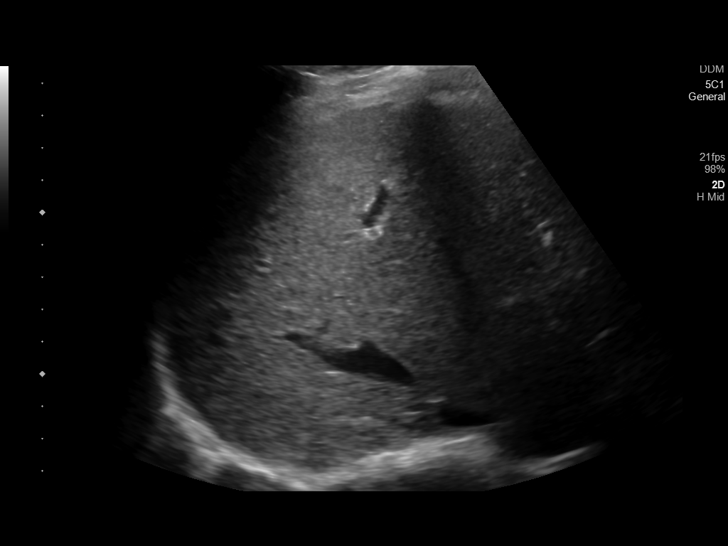
[im 53/58]
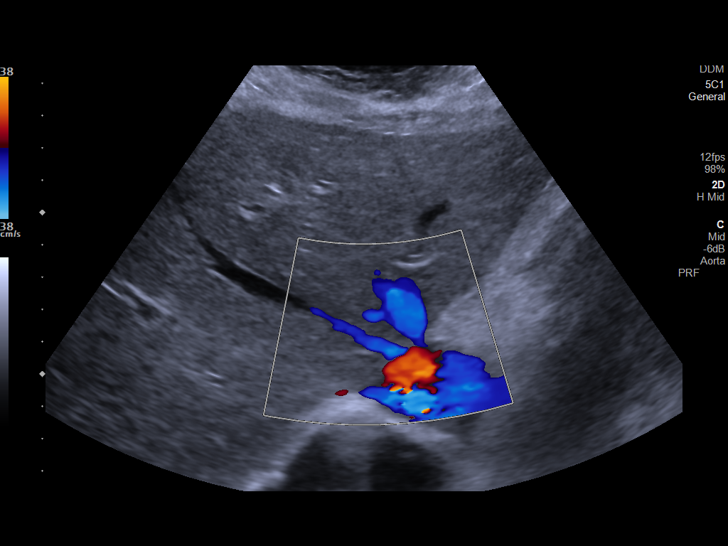
[im 58/58]
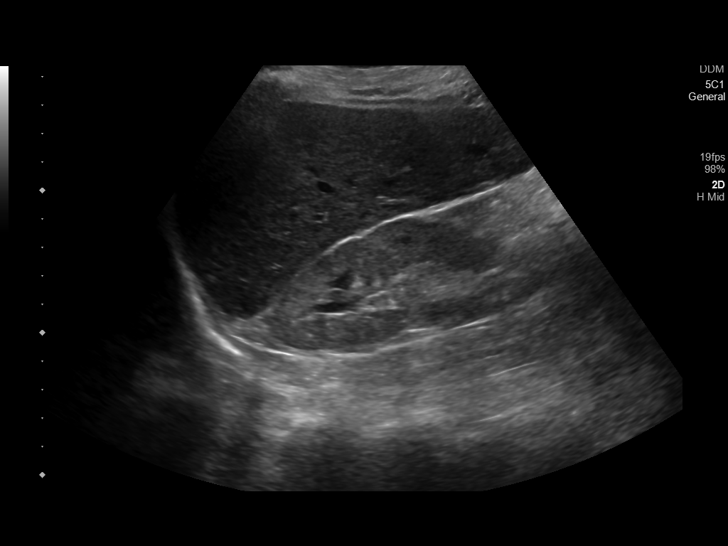

[14 of 25 positions shown; findings below may reference images not displayed]

FINDINGS: Gallbladder:

No gallstones or wall thickening visualized. No sonographic Murphy
sign noted by sonographer.

Common bile duct:

Diameter: 3 mm

Liver:

No focal solid lesion identified. 6 mm cyst in the left lobe of the
liver. Within normal limits in parenchymal echogenicity. Portal vein
is patent on color Doppler imaging with normal direction of blood
flow towards the liver.

Other: None.
IMPRESSION: Unremarkable right upper quadrant ultrasound.

## 2022-12-30 IMAGING — MG MM DIGITAL SCREENING BILAT W/ TOMO AND CAD
8 series · 9 of 24 positions shown · non-contrast
Comparison: Previous exam(s).

CLINICAL DATA: Screening.

EXAM:
DIGITAL SCREENING BILATERAL MAMMOGRAM WITH TOMOSYNTHESIS AND CAD
TECHNIQUE: Bilateral screening digital craniocaudal and mediolateral oblique
mammograms were obtained. Bilateral screening digital breast
tomosynthesis was performed. The images were evaluated with
computer-aided detection.

[R CC synth-2D]
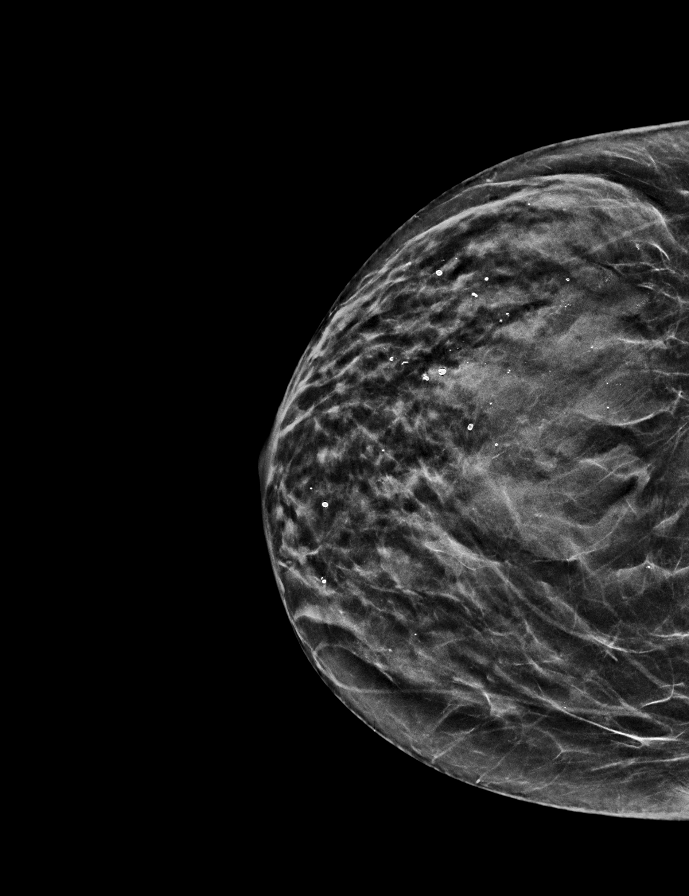

[L CC synth-2D]
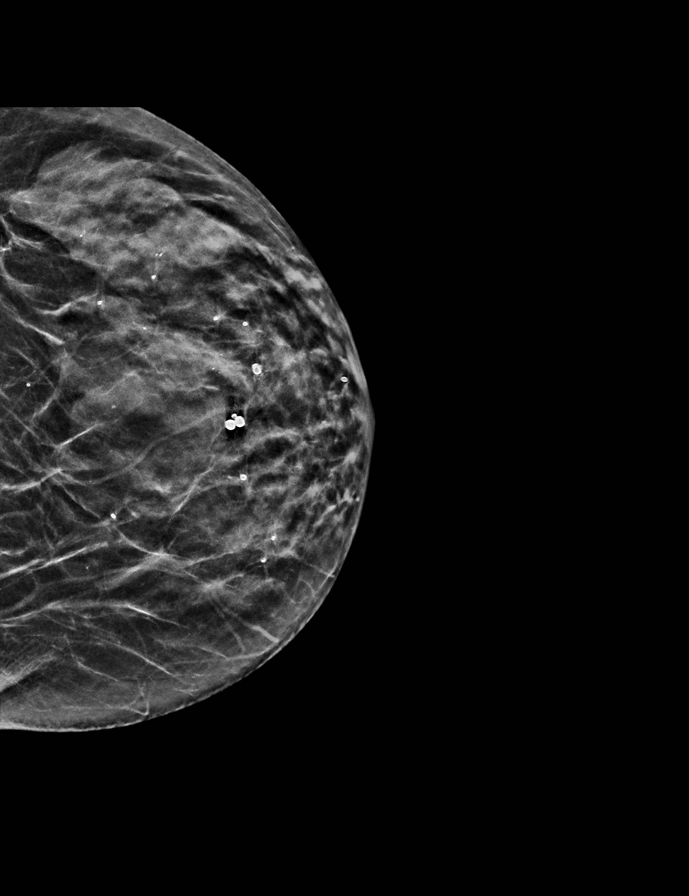

[L MLO synth-2D]
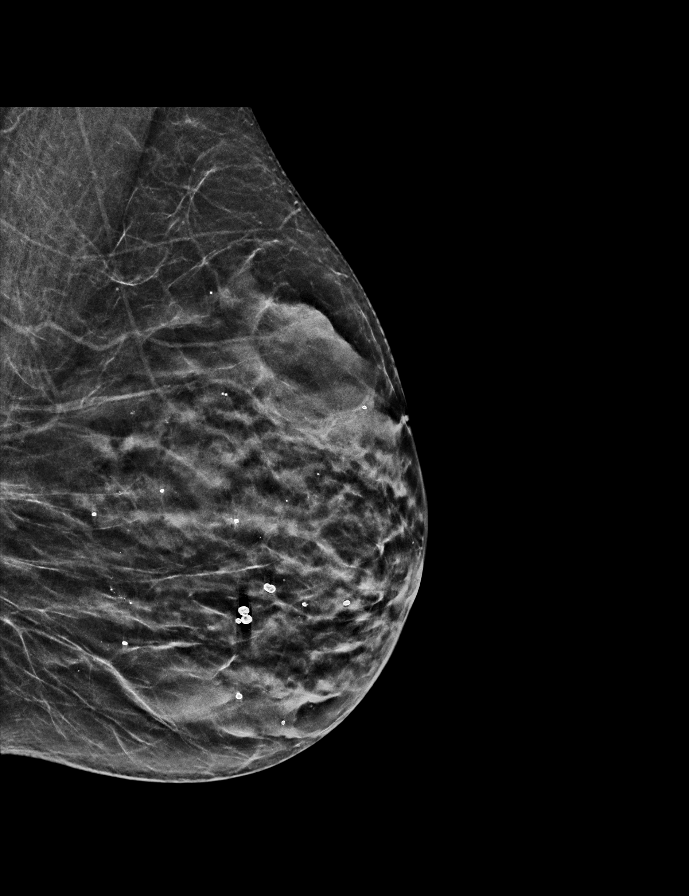

[R MLO synth-2D]
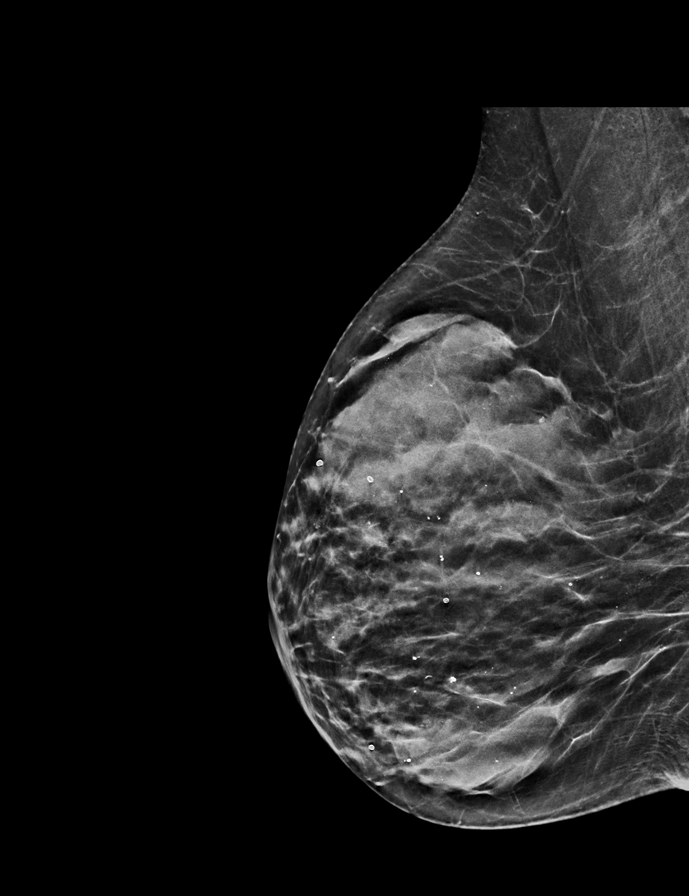

[L MLO tomo · 2 of 44 frames shown]
[frame 15/44]
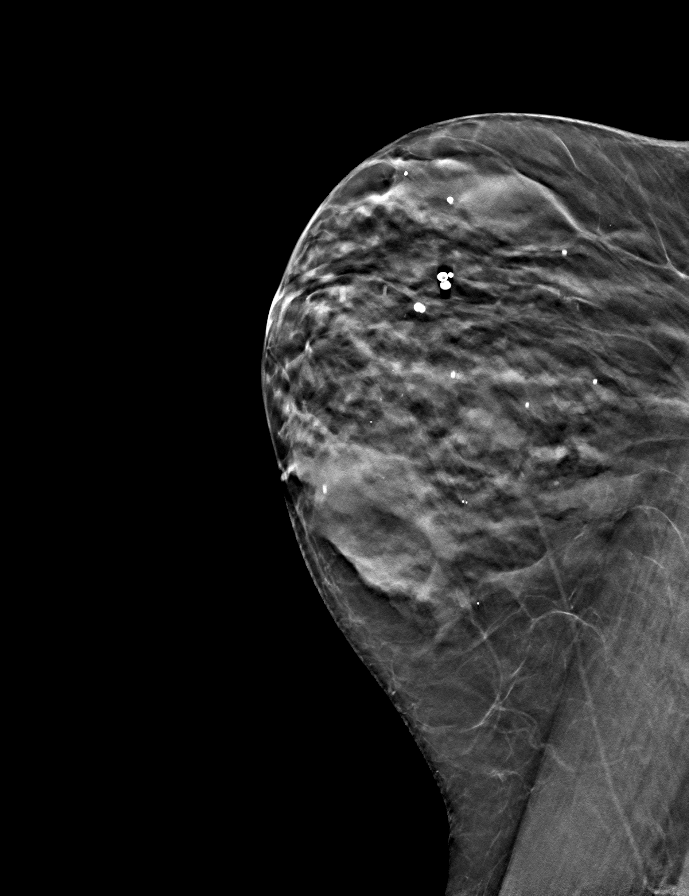
[frame 23/44]
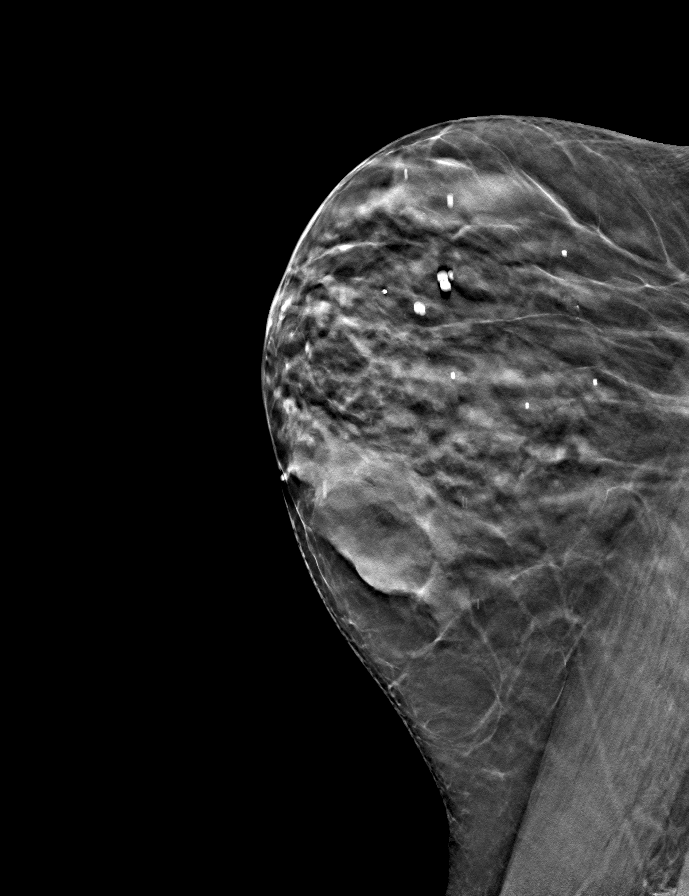

[R CC tomo · tomo slice 23/44.0]
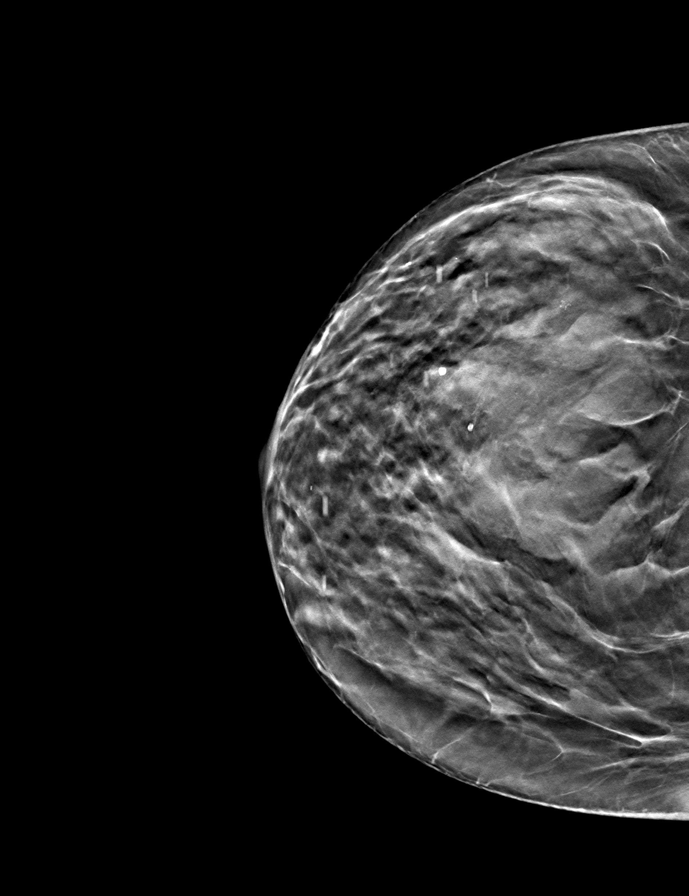

[R MLO tomo · tomo slice 25/50.0]
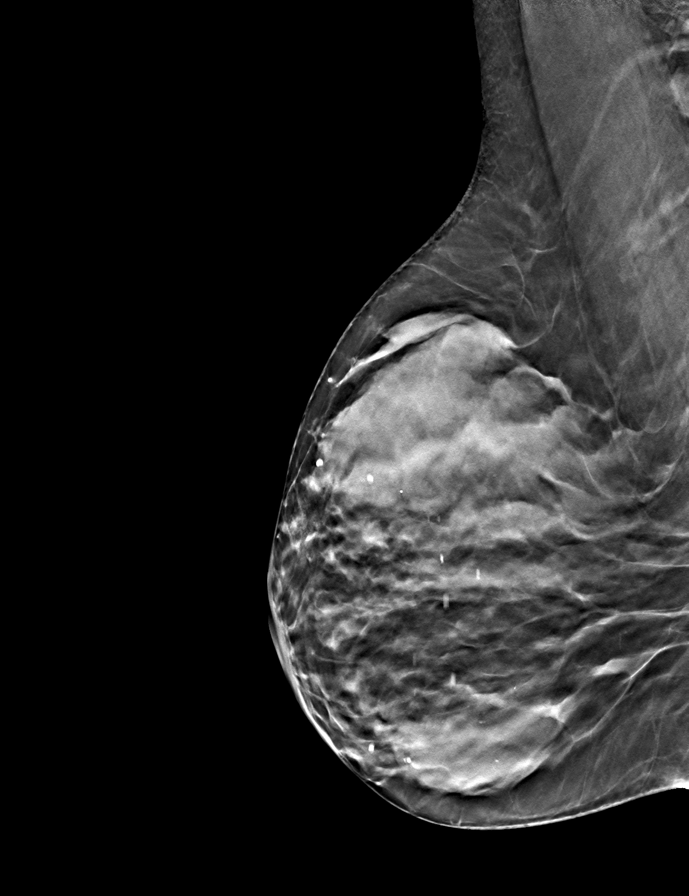

[L CC tomo · tomo slice 24/47.0]
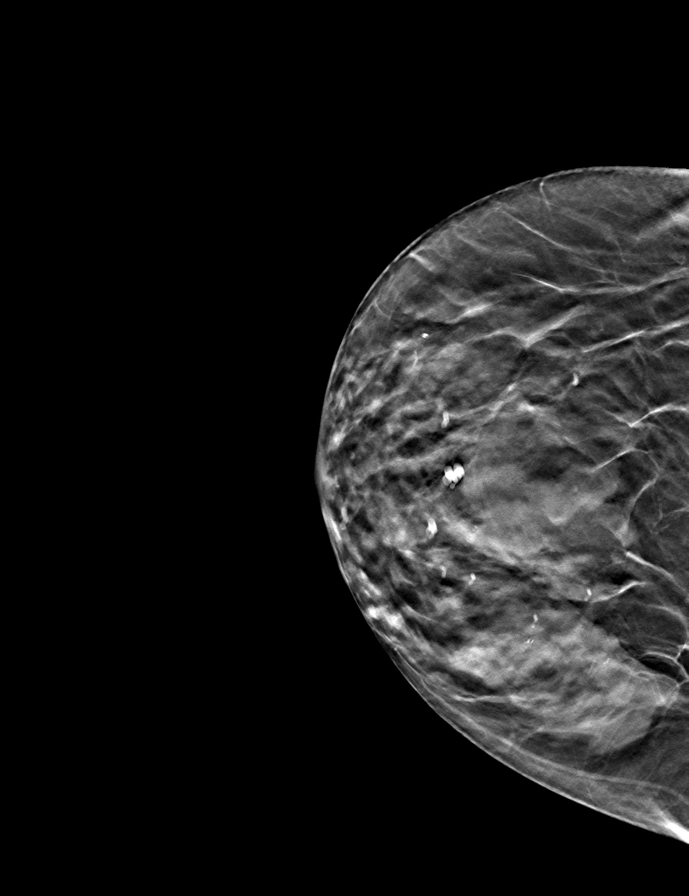

[9 of 24 positions shown; findings below may reference images not displayed]

ACR Breast Density Category d: The breast tissue is extremely dense,
which lowers the sensitivity of mammography
FINDINGS: There are no findings suspicious for malignancy.
IMPRESSION: No mammographic evidence of malignancy. A result letter of this
screening mammogram will be mailed directly to the patient.

RECOMMENDATION:
Screening mammogram in one year. (Code:TA-V-WV9)

BI-RADS CATEGORY  1: Negative.

## 2023-07-25 ENCOUNTER — Other Ambulatory Visit: Payer: Self-pay | Admitting: Internal Medicine

## 2023-07-25 DIAGNOSIS — Z1231 Encounter for screening mammogram for malignant neoplasm of breast: Secondary | ICD-10-CM

## 2023-09-07 ENCOUNTER — Ambulatory Visit
Admission: RE | Admit: 2023-09-07 | Discharge: 2023-09-07 | Disposition: A | Discharge status: Home / Self Care | Source: Ambulatory Visit | Attending: Internal Medicine | Admitting: Internal Medicine

## 2023-09-07 DIAGNOSIS — Z1231 Encounter for screening mammogram for malignant neoplasm of breast: Secondary | ICD-10-CM | POA: Diagnosis present

## 2024-01-12 ENCOUNTER — Encounter: Payer: Self-pay | Admitting: *Deleted

## 2024-01-12 ENCOUNTER — Encounter: Payer: Self-pay | Admitting: Oncology

## 2024-01-12 ENCOUNTER — Other Ambulatory Visit: Payer: Self-pay

## 2024-01-12 ENCOUNTER — Emergency Department: Payer: Self-pay

## 2024-01-12 ENCOUNTER — Emergency Department
Admission: EM | Admit: 2024-01-12 | Discharge: 2024-01-13 | Disposition: A | Discharge status: Home / Self Care | Payer: Self-pay | Attending: Emergency Medicine | Admitting: Emergency Medicine

## 2024-01-12 DIAGNOSIS — J101 Influenza due to other identified influenza virus with other respiratory manifestations: Secondary | ICD-10-CM | POA: Insufficient documentation

## 2024-01-12 DIAGNOSIS — I10 Essential (primary) hypertension: Secondary | ICD-10-CM | POA: Insufficient documentation

## 2024-01-12 DIAGNOSIS — R509 Fever, unspecified: Secondary | ICD-10-CM

## 2024-01-12 DIAGNOSIS — R051 Acute cough: Secondary | ICD-10-CM

## 2024-01-12 DIAGNOSIS — E876 Hypokalemia: Secondary | ICD-10-CM | POA: Insufficient documentation

## 2024-01-12 LAB — URINALYSIS, W/ REFLEX TO CULTURE (INFECTION SUSPECTED)
Bilirubin Urine: NEGATIVE
Glucose, UA: NEGATIVE mg/dL
Hgb urine dipstick: NEGATIVE
Ketones, ur: NEGATIVE mg/dL
Leukocytes,Ua: NEGATIVE
Nitrite: NEGATIVE
Protein, ur: NEGATIVE mg/dL
Specific Gravity, Urine: 1.016 (ref 1.005–1.030)
pH: 5 (ref 5.0–8.0)

## 2024-01-12 LAB — COMPREHENSIVE METABOLIC PANEL WITH GFR
ALT: 77 U/L — ABNORMAL HIGH (ref 0–44)
AST: 74 U/L — ABNORMAL HIGH (ref 15–41)
Albumin: 3.8 g/dL (ref 3.5–5.0)
Alkaline Phosphatase: 84 U/L (ref 38–126)
Anion gap: 12 (ref 5–15)
BUN: 17 mg/dL (ref 8–23)
CO2: 25 mmol/L (ref 22–32)
Calcium: 8.8 mg/dL — ABNORMAL LOW (ref 8.9–10.3)
Chloride: 101 mmol/L (ref 98–111)
Creatinine, Ser: 1.18 mg/dL — ABNORMAL HIGH (ref 0.44–1.00)
GFR, Estimated: 49 mL/min — ABNORMAL LOW
Glucose, Bld: 133 mg/dL — ABNORMAL HIGH (ref 70–99)
Potassium: 3.1 mmol/L — ABNORMAL LOW (ref 3.5–5.1)
Sodium: 139 mmol/L (ref 135–145)
Total Bilirubin: 0.5 mg/dL (ref 0.0–1.2)
Total Protein: 6.5 g/dL (ref 6.5–8.1)

## 2024-01-12 LAB — CBC WITH DIFFERENTIAL/PLATELET
Abs Immature Granulocytes: 0.05 K/uL (ref 0.00–0.07)
Basophils Absolute: 0 K/uL (ref 0.0–0.1)
Basophils Relative: 0 %
Eosinophils Absolute: 0 K/uL (ref 0.0–0.5)
Eosinophils Relative: 0 %
HCT: 29.8 % — ABNORMAL LOW (ref 36.0–46.0)
Hemoglobin: 10.3 g/dL — ABNORMAL LOW (ref 12.0–15.0)
Immature Granulocytes: 1 %
Lymphocytes Relative: 6 %
Lymphs Abs: 0.5 K/uL — ABNORMAL LOW (ref 0.7–4.0)
MCH: 28.8 pg (ref 26.0–34.0)
MCHC: 34.6 g/dL (ref 30.0–36.0)
MCV: 83.2 fL (ref 80.0–100.0)
Monocytes Absolute: 0.9 K/uL (ref 0.1–1.0)
Monocytes Relative: 11 %
Neutro Abs: 6.9 K/uL (ref 1.7–7.7)
Neutrophils Relative %: 82 %
Platelets: 217 K/uL (ref 150–400)
RBC: 3.58 MIL/uL — ABNORMAL LOW (ref 3.87–5.11)
RDW: 14.4 % (ref 11.5–15.5)
WBC: 8.4 K/uL (ref 4.0–10.5)
nRBC: 0 % (ref 0.0–0.2)

## 2024-01-12 LAB — RESP PANEL BY RT-PCR (RSV, FLU A&B, COVID)  RVPGX2
Influenza A by PCR: POSITIVE — AB
Influenza B by PCR: NEGATIVE
Resp Syncytial Virus by PCR: NEGATIVE
SARS Coronavirus 2 by RT PCR: NEGATIVE

## 2024-01-12 LAB — LACTIC ACID, PLASMA: Lactic Acid, Venous: 0.9 mmol/L (ref 0.5–1.9)

## 2024-01-12 LAB — PROTIME-INR
INR: 1.3 — ABNORMAL HIGH (ref 0.8–1.2)
Prothrombin Time: 17.1 s — ABNORMAL HIGH (ref 11.4–15.2)

## 2024-01-12 MED ORDER — ONDANSETRON HCL 4 MG/2ML IJ SOLN
4.0000 mg | Freq: Once | INTRAMUSCULAR | Status: AC
  Administered 2024-01-12: 4 mg via INTRAVENOUS
  Filled 2024-01-12: qty 2

## 2024-01-12 MED ORDER — SODIUM CHLORIDE 0.9 % IV BOLUS
1000.0000 mL | Freq: Once | INTRAVENOUS | Status: AC
  Administered 2024-01-12: 1000 mL via INTRAVENOUS

## 2024-01-12 MED ORDER — KETOROLAC TROMETHAMINE 30 MG/ML IJ SOLN
15.0000 mg | Freq: Once | INTRAMUSCULAR | Status: AC
  Administered 2024-01-12: 15 mg via INTRAVENOUS
  Filled 2024-01-12: qty 1

## 2024-01-12 MED ORDER — ACETAMINOPHEN 500 MG PO TABS
1000.0000 mg | ORAL_TABLET | Freq: Once | ORAL | Status: AC
  Administered 2024-01-12: 1000 mg via ORAL
  Filled 2024-01-12: qty 2

## 2024-01-12 NOTE — ED Triage Notes (Addendum)
 Pt to triage via wheelchair.  Pt reports cough, decreased appetite, bodyaches and fever   sx for 2 days. Pt report sob.  No chest pain.  Cig smoker.  Pt alert  pt to room 6 from triage.

## 2024-01-12 NOTE — ED Provider Notes (Signed)
 "  [facility] Provider Note    Event Date/Time   First MD Initiated Contact with Patient 01/12/24 2115     (approximate)  History   Chief Complaint: Cough  HPI  Gina Nguyen is a 69 y.o. female with a past medical history of arthritis, hypertension, hyperlipidemia, presents to the emergency department for shortness of breath cough congestion weakness.  According to the patient since yesterday she has been coughing with congestion and feeling short of breath.  Patient currently satting 90% on room air in triage brought back to the emergency department room for evaluation.  Patient found to be febrile at 103.1 tachycardic and tachypneic.  Blood pressures reassuring.  Patient denies any pain states generalized weakness nausea and vomiting.  Physical Exam   Triage Vital Signs: ED Triage Vitals  Encounter Vitals Group     BP 01/12/24 2107 (!) 153/59     Girls Systolic BP Percentile --      Girls Diastolic BP Percentile --      Boys Systolic BP Percentile --      Boys Diastolic BP Percentile --      Pulse Rate 01/12/24 2107 (!) 134     Resp 01/12/24 2107 (!) 26     Temp 01/12/24 2107 (!) 103.1 F (39.5 C)     Temp Source 01/12/24 2107 Oral     SpO2 01/12/24 2107 90 %     Weight 01/12/24 2104 114 lb (51.7 kg)     Height 01/12/24 2104 5' 5 (1.651 m)     Head Circumference --      Peak Flow --      Pain Score 01/12/24 2102 7     Pain Loc --      Pain Education --      Exclude from Growth Chart --     Most recent vital signs: Vitals:   01/12/24 2107  BP: (!) 153/59  Pulse: (!) 134  Resp: (!) 26  Temp: (!) 103.1 F (39.5 C)  SpO2: 90%    General: Awake, no distress.  CV:  Good peripheral perfusion.  Regular rate and rhythm  Resp:  Normal effort.  Equal breath sounds bilaterally.  Abd:  No distention.  Soft, nontender.  No rebound or guarding.  ED Results / Procedures / Treatments   EKG  EKG viewed and interpreted by myself shows sinus  tachycardia at 133 bpm with a narrow QRS, normal axis, normal intervals, nonspecific ST changes.  No ST elevation.  RADIOLOGY  I viewed and interpreted the chest x-ray images.  No consolidation on my evaluation. Radiology has read the x-ray as negative.   MEDICATIONS ORDERED IN ED: Medications  sodium chloride  0.9 % bolus 1,000 mL (has no administration in time range)  ketorolac  (TORADOL ) 30 MG/ML injection 15 mg (has no administration in time range)  ondansetron  (ZOFRAN ) injection 4 mg (has no administration in time range)  acetaminophen  (TYLENOL ) tablet 1,000 mg (1,000 mg Oral Given 01/12/24 2111)     IMPRESSION / MDM / ASSESSMENT AND PLAN / ED COURSE  I reviewed the triage vital signs and the nursing notes.  Patient's presentation is most consistent with acute presentation with potential threat to life or bodily function.  Patient presents to the emergency department for shortness of breath weakness cough congestion found to be febrile tachypneic and tachycardic.  Symptoms seem very suggestive of infectious etiology such as influenza.  We will check labs we will obtain a COVID/flu swab.  Will  obtain an x-ray to evaluate for pneumonia given her lower O2 saturation.  We will treat with IV fluids Toradol  Tylenol  Zofran  while awaiting further results.  Patient agreeable to plan of care.  Patient's workup shows a reassuring CBC with normal white blood cell count reassuring chemistry with just very minimal LFT elevation.  Lactic acid reassuringly normal.  Chest x-ray is clear.  However given the patient's cough fever chills highly suspect influenza.  Influenza test is pending.  Patient receiving fluids and medication.  Patient care signed out to oncoming provider.  FINAL CLINICAL IMPRESSION(S) / ED DIAGNOSES   Dyspnea Fever   Note:  This document was prepared using Dragon voice recognition software and may include unintentional dictation errors.   Dorothyann Drivers, MD 01/12/24  2318  "

## 2024-01-13 MED ORDER — ONDANSETRON 4 MG PO TBDP: 4.0000 mg | ORAL_TABLET | Freq: Three times a day (TID) | ORAL | 0 refills | Status: AC | PRN

## 2024-01-13 MED ORDER — OSELTAMIVIR PHOSPHATE 30 MG PO CAPS: 30.0000 mg | ORAL_CAPSULE | Freq: Two times a day (BID) | ORAL | 0 refills | Status: AC

## 2024-01-13 NOTE — Discharge Instructions (Addendum)
 For influenza, take Tamiflu  5 days twice daily. Take Tylenol  650 mg every 6 hours as needed for fever/aches/pain.  Drink plenty of fluids to stay well-hydrated.  Find Pedialyte or similar electrolyte rehydration formulas at your local pharmacy.  Call your doctor for follow-up appointment.  Ask for a sleep study as well, as you may have undiagnosed sleep apnea.

## 2024-01-13 NOTE — ED Provider Notes (Signed)
 Influenza A positive. Patient's vital signs much improved. I reassessed the patient, has been able to perform activities of daily living without issues. Will give Tamiflu . Appropriate for discharge.  Knows to follow-up with PMD.  Return precautions given.  Walking saturation is 95 to 96%.  Only when she falls asleep will desaturate to 90%.  Partner at bedside states that she snores at night.  Probable undiagnosed sleep apnea.  Informed to follow-up with sleep study.   Gina Mylar, MD 01/13/24 [phone]

## 2024-01-17 LAB — CULTURE, BLOOD (ROUTINE X 2)
Culture: NO GROWTH
Culture: NO GROWTH
Special Requests: ADEQUATE
Special Requests: ADEQUATE
